# Patient Record
Sex: Male | Born: 1950 | Race: Black or African American | Hispanic: No | Smoking: Never smoker
Health system: Southern US, Community
[De-identification: ages and names within clinical notes are randomized; demographics above are authoritative.]

## PROBLEM LIST (undated history)

## (undated) DIAGNOSIS — R51 Headache: Secondary | ICD-10-CM

## (undated) DIAGNOSIS — R972 Elevated prostate specific antigen [PSA]: Secondary | ICD-10-CM

## (undated) DIAGNOSIS — R519 Headache, unspecified: Secondary | ICD-10-CM

## (undated) DIAGNOSIS — D3500 Benign neoplasm of unspecified adrenal gland: Secondary | ICD-10-CM

## (undated) DIAGNOSIS — M1990 Unspecified osteoarthritis, unspecified site: Secondary | ICD-10-CM

## (undated) DIAGNOSIS — N400 Enlarged prostate without lower urinary tract symptoms: Secondary | ICD-10-CM

## (undated) DIAGNOSIS — R319 Hematuria, unspecified: Secondary | ICD-10-CM

## (undated) DIAGNOSIS — R361 Hematospermia: Secondary | ICD-10-CM

## (undated) DIAGNOSIS — H269 Unspecified cataract: Secondary | ICD-10-CM

## (undated) HISTORY — DX: Benign neoplasm of unspecified adrenal gland: D35.00

## (undated) HISTORY — DX: Headache, unspecified: R51.9

## (undated) HISTORY — DX: Elevated prostate specific antigen (PSA): R97.20

## (undated) HISTORY — DX: Headache: R51

## (undated) HISTORY — DX: Unspecified osteoarthritis, unspecified site: M19.90

## (undated) HISTORY — DX: Hematuria, unspecified: R31.9

## (undated) HISTORY — DX: Hematospermia: R36.1

## (undated) HISTORY — DX: Unspecified cataract: H26.9

## (undated) HISTORY — DX: Benign prostatic hyperplasia without lower urinary tract symptoms: N40.0

## (undated) HISTORY — PX: PROSTATE BIOPSY: SHX241

---

## 1998-11-21 ENCOUNTER — Ambulatory Visit (HOSPITAL_COMMUNITY): Admission: RE | Admit: 1998-11-21 | Discharge: 1998-11-21 | Payer: Self-pay | Admitting: Orthopedic Surgery

## 1998-11-21 ENCOUNTER — Encounter: Payer: Self-pay | Admitting: Orthopedic Surgery

## 1999-01-12 ENCOUNTER — Ambulatory Visit (HOSPITAL_COMMUNITY): Admission: RE | Admit: 1999-01-12 | Discharge: 1999-01-12 | Payer: Self-pay | Admitting: Neurosurgery

## 1999-01-13 ENCOUNTER — Encounter: Payer: Self-pay | Admitting: Neurosurgery

## 1999-01-21 ENCOUNTER — Encounter: Payer: Self-pay | Admitting: Neurosurgery

## 1999-01-21 ENCOUNTER — Ambulatory Visit (HOSPITAL_COMMUNITY): Admission: RE | Admit: 1999-01-21 | Discharge: 1999-01-21 | Payer: Self-pay | Admitting: Neurosurgery

## 1999-02-12 ENCOUNTER — Emergency Department (HOSPITAL_COMMUNITY): Admission: EM | Admit: 1999-02-12 | Discharge: 1999-02-12 | Payer: Self-pay | Admitting: Emergency Medicine

## 1999-06-06 ENCOUNTER — Emergency Department (HOSPITAL_COMMUNITY): Admission: EM | Admit: 1999-06-06 | Discharge: 1999-06-06 | Payer: Self-pay | Admitting: Emergency Medicine

## 1999-08-05 ENCOUNTER — Ambulatory Visit (HOSPITAL_COMMUNITY): Admission: RE | Admit: 1999-08-05 | Discharge: 1999-08-05 | Payer: Self-pay | Admitting: Neurosurgery

## 1999-08-05 ENCOUNTER — Encounter: Payer: Self-pay | Admitting: Neurosurgery

## 2000-04-11 ENCOUNTER — Encounter: Admission: RE | Admit: 2000-04-11 | Discharge: 2000-07-10 | Payer: Self-pay | Admitting: Anesthesiology

## 2000-05-04 ENCOUNTER — Encounter: Payer: Self-pay | Admitting: Neurosurgery

## 2000-05-04 ENCOUNTER — Ambulatory Visit (HOSPITAL_COMMUNITY): Admission: RE | Admit: 2000-05-04 | Discharge: 2000-05-04 | Payer: Self-pay | Admitting: Neurosurgery

## 2000-07-16 ENCOUNTER — Encounter: Admission: RE | Admit: 2000-07-16 | Discharge: 2000-10-14 | Payer: Self-pay | Admitting: Anesthesiology

## 2000-08-09 ENCOUNTER — Encounter: Payer: Self-pay | Admitting: Family Medicine

## 2000-08-09 ENCOUNTER — Encounter: Admission: RE | Admit: 2000-08-09 | Discharge: 2000-08-09 | Payer: Self-pay | Admitting: Family Medicine

## 2001-08-22 ENCOUNTER — Encounter (INDEPENDENT_AMBULATORY_CARE_PROVIDER_SITE_OTHER): Payer: Self-pay | Admitting: *Deleted

## 2001-08-22 ENCOUNTER — Ambulatory Visit (HOSPITAL_COMMUNITY): Admission: RE | Admit: 2001-08-22 | Discharge: 2001-08-22 | Payer: Self-pay | Admitting: Gastroenterology

## 2004-11-27 HISTORY — PX: SHOULDER SURGERY: SHX246

## 2004-11-27 HISTORY — PX: ROTATOR CUFF REPAIR: SHX139

## 2004-11-27 HISTORY — PX: CATARACT EXTRACTION: SUR2

## 2005-07-09 ENCOUNTER — Encounter: Admission: RE | Admit: 2005-07-09 | Discharge: 2005-07-09 | Payer: Self-pay | Admitting: Family Medicine

## 2005-08-09 ENCOUNTER — Encounter: Admission: RE | Admit: 2005-08-09 | Discharge: 2005-08-09 | Payer: Self-pay | Admitting: Family Medicine

## 2005-09-01 ENCOUNTER — Encounter: Admission: RE | Admit: 2005-09-01 | Discharge: 2005-09-01 | Payer: Self-pay | Admitting: Family Medicine

## 2005-09-17 ENCOUNTER — Encounter: Admission: RE | Admit: 2005-09-17 | Discharge: 2005-09-17 | Payer: Self-pay | Admitting: Family Medicine

## 2005-09-28 ENCOUNTER — Encounter: Admission: RE | Admit: 2005-09-28 | Discharge: 2005-09-28 | Payer: Self-pay | Admitting: Family Medicine

## 2005-10-29 ENCOUNTER — Encounter: Admission: RE | Admit: 2005-10-29 | Discharge: 2005-10-29 | Payer: Self-pay | Admitting: Family Medicine

## 2005-11-10 ENCOUNTER — Ambulatory Visit (HOSPITAL_BASED_OUTPATIENT_CLINIC_OR_DEPARTMENT_OTHER): Admission: RE | Admit: 2005-11-10 | Discharge: 2005-11-10 | Payer: Self-pay | Admitting: Orthopaedic Surgery

## 2005-11-10 ENCOUNTER — Ambulatory Visit (HOSPITAL_COMMUNITY): Admission: RE | Admit: 2005-11-10 | Discharge: 2005-11-10 | Payer: Self-pay | Admitting: Orthopaedic Surgery

## 2005-11-24 ENCOUNTER — Encounter: Admission: RE | Admit: 2005-11-24 | Discharge: 2005-11-24 | Payer: Self-pay | Admitting: Family Medicine

## 2005-11-28 ENCOUNTER — Encounter: Admission: RE | Admit: 2005-11-28 | Discharge: 2006-01-09 | Payer: Self-pay | Admitting: Orthopaedic Surgery

## 2006-01-22 ENCOUNTER — Ambulatory Visit: Payer: Self-pay | Admitting: Internal Medicine

## 2006-02-12 ENCOUNTER — Encounter: Admission: RE | Admit: 2006-02-12 | Discharge: 2006-05-13 | Payer: Self-pay | Admitting: Internal Medicine

## 2006-03-14 ENCOUNTER — Ambulatory Visit: Payer: Self-pay | Admitting: Internal Medicine

## 2006-04-15 ENCOUNTER — Encounter: Admission: RE | Admit: 2006-04-15 | Discharge: 2006-04-15 | Payer: Self-pay | Admitting: Family Medicine

## 2006-05-14 ENCOUNTER — Encounter: Admission: RE | Admit: 2006-05-14 | Discharge: 2006-08-12 | Payer: Self-pay | Admitting: Internal Medicine

## 2006-07-03 ENCOUNTER — Encounter: Admission: RE | Admit: 2006-07-03 | Discharge: 2006-07-03 | Payer: Self-pay | Admitting: Family Medicine

## 2006-07-10 ENCOUNTER — Encounter: Admission: RE | Admit: 2006-07-10 | Discharge: 2006-07-10 | Payer: Self-pay | Admitting: Specialist

## 2006-08-01 ENCOUNTER — Encounter: Admission: RE | Admit: 2006-08-01 | Discharge: 2006-08-01 | Payer: Self-pay | Admitting: Specialist

## 2006-08-13 ENCOUNTER — Encounter: Admission: RE | Admit: 2006-08-13 | Discharge: 2006-11-11 | Payer: Self-pay | Admitting: Internal Medicine

## 2006-09-19 ENCOUNTER — Encounter: Admission: RE | Admit: 2006-09-19 | Discharge: 2006-09-19 | Payer: Self-pay | Admitting: Specialist

## 2007-03-01 ENCOUNTER — Encounter: Admission: RE | Admit: 2007-03-01 | Discharge: 2007-03-01 | Payer: Self-pay | Admitting: Family Medicine

## 2007-04-20 ENCOUNTER — Emergency Department (HOSPITAL_COMMUNITY): Admission: EM | Admit: 2007-04-20 | Discharge: 2007-04-20 | Payer: Self-pay | Admitting: Emergency Medicine

## 2007-04-27 ENCOUNTER — Ambulatory Visit (HOSPITAL_COMMUNITY): Admission: RE | Admit: 2007-04-27 | Discharge: 2007-04-27 | Payer: Self-pay | Admitting: Family Medicine

## 2007-05-01 ENCOUNTER — Ambulatory Visit (HOSPITAL_COMMUNITY): Admission: RE | Admit: 2007-05-01 | Discharge: 2007-05-01 | Payer: Self-pay | Admitting: Orthopaedic Surgery

## 2007-05-27 ENCOUNTER — Encounter: Admission: RE | Admit: 2007-05-27 | Discharge: 2007-05-27 | Payer: Self-pay | Admitting: Orthopaedic Surgery

## 2007-08-01 ENCOUNTER — Encounter: Admission: RE | Admit: 2007-08-01 | Discharge: 2007-08-01 | Payer: Self-pay | Admitting: Family Medicine

## 2007-11-01 ENCOUNTER — Encounter: Admission: RE | Admit: 2007-11-01 | Discharge: 2007-11-01 | Payer: Self-pay | Admitting: Family Medicine

## 2007-11-12 ENCOUNTER — Encounter: Admission: RE | Admit: 2007-11-12 | Discharge: 2007-11-12 | Payer: Self-pay | Admitting: Family Medicine

## 2008-01-07 ENCOUNTER — Encounter: Admission: RE | Admit: 2008-01-07 | Discharge: 2008-01-07 | Payer: Self-pay | Admitting: Family Medicine

## 2008-01-22 ENCOUNTER — Ambulatory Visit (HOSPITAL_COMMUNITY): Admission: RE | Admit: 2008-01-22 | Discharge: 2008-01-22 | Payer: Self-pay | Admitting: Family Medicine

## 2010-12-17 ENCOUNTER — Encounter: Payer: Self-pay | Admitting: Family Medicine

## 2010-12-18 ENCOUNTER — Encounter: Payer: Self-pay | Admitting: Specialist

## 2010-12-18 ENCOUNTER — Encounter: Payer: Self-pay | Admitting: Family Medicine

## 2011-04-14 NOTE — Procedures (Signed)
[place]  Patient:    Ronald Davenport, Ronald Davenport Visit Number: [id number] MRN: [id number]          Service Type: END Location: ENDO Attending Physician:  Charna Elizabeth Dictated by:   Anselmo Rod, M.D. Proc. Date: 08/22/01 Admit Date:  08/22/2001   CC:         Gabriel Earing, M.D.   Procedure Report  DATE OF BIRTH:  [birth year].  REFERRING PHYSICIAN:  Gabriel Earing, M.D.  PROCEDURE PERFORMED:  Colonoscopy with biopsies.  ENDOSCOPIST:  Anselmo Rod, M.D.  INSTRUMENT USED:  Olympus video colonoscope.  INDICATIONS FOR PROCEDURE:  The patient is a 60 year old African-American male with a history of blood in stool and family history of colon cancer (a brother diagnosed in early 18s), Rule out colonic polyps, masses, hemorrhoids, etc.  PREPROCEDURE PREPARATION:  Informed consent was procured from the patient. The patient was fasted for eight hours prior to the procedure and prepped with a bottle of magnesium citrate and a gallon of NuLytely the night prior to the procedure.  PREPROCEDURE PHYSICAL:  The patient had stable vital signs.  Neck supple. Chest clear to auscultation.  S1, S2 regular.  Abdomen soft with normal abdominal bowel sounds.  DESCRIPTION OF PROCEDURE:  The patient was placed in the left lateral decubitus position and sedated with 40 mg of Demerol and 4 mg of Versed intravenously.  Once the patient was adequately sedated and maintained on low-flow oxygen and continuous cardiac monitoring, the Olympus video colonoscope was advanced from the rectum to the cecum without difficulty. The patient had a fairly good prep.  There was a small edematous fold seen at 60 cm with some fresh bleeding around it.  Reason for this was unclear.  I doubt if this is scope trauma, multiple biopsies were done to rule out malignancy.  There were a few early left-sided diverticula seen in the left colon.  No other abnormalities were noted up to the  cecum.  IMPRESSION: 1. Small edematous fold biopsied at 60 cm. 2. Early left-sided diverticula. 3. No other masses or polyps seen.  RECOMMENDATIONS: 1. Await pathology results.  Avoid all nonsteroidals. 2. Outpatient follow-up in the office in the next two weeks.Dictated by: Anselmo Rod, M.D. Attending Physician:  Charna Elizabeth DD:  08/22/01 TD:  08/22/01 Job: [id number]

## 2011-04-14 NOTE — Op Note (Signed)
NAME:  Ronald Davenport, Ronald Davenport                 ACCOUNT NO.:  [id number]   MEDICAL RECORD NO.:  [id number]          PATIENT TYPE:  AMB   LOCATION:  DSC                          FACILITY:  [facility]   PHYSICIAN:  Vanita Panda. Magnus Ivan, M.D.DATE OF BIRTH:  12/06/1950   DATE OF PROCEDURE:  11/10/2005  DATE OF DISCHARGE:                                 OPERATIVE REPORT   PREOPERATIVE DIAGNOSIS:  Right shoulder impingement with partial-thickness  rotator cuff tear and significant acromioclavicular joint arthritis.   POSTOPERATIVE DIAGNOSIS:  Right shoulder impingement with partial-thickness  rotator cuff tear and significant acromioclavicular joint arthritis.   PROCEDURE:  1.  Right shoulder arthroscopy with debridement.  2.  Subacromial decompression.  3.  Open distal clavicle resection.   SURGEON:  Vanita Panda. Magnus Ivan, MD   ANESTHESIA:  1.  Interscalene right shoulder block.  2.  General anesthesia.   BLOOD LOSS:  Minimal.   ANTIBIOTICS:  1 gram IV Ancef.   COMPLICATIONS:  None.   INDICATIONS:  Briefly Mr. Minahan is a pleasant 60 year old gentleman from  [place] who has lived in the [place] for some time. He had been  experiencing right shoulder pain for several years. He eventually did seek  medical care for this.  He was followed by a primary care physician for a  year and had injections in his [facility] joint on the right shoulder as well as the  subacromial space. He continued to have shoulder pain in spite of physical  therapy and nonsteroidal anti-inflammatory medications. An MRI was obtained  that showed bursitis in the subacromial space and significant AC joint  arthritis and also was significant for a partial thickness supraspinatus  tendon tear. Due to continued shoulder pain in his dominant shoulder, it is  recommended that he undergo arthroscopy.  The risks and benefits of this  were explained to him. I also talked to him about open distal clavicle  resection given the amount  of his arthritis; and he understood the  possibility of this; and also explained to him the need for a rotator cuff  repair if there was a full-thickness tear; and he agreed with this as well.   PROCEDURE DESCRIPTION:  After informed consent was obtained and interscalene  block was obtained on the right shoulder; he was brought to the operating  room, placed supine on the operating table, and general anesthesia was then  obtained.  Mr. Messmer was then positioned into a beach chair position and a  fishing pole was used as in-line traction with the humerus with about 45-  degrees of forward flexion and neutral rotation. The arm was prepped and  draped with DuraPrep and sterile drapes.  Incisions were then made  posteriorly off of the posterolateral edge of the acromion one fingerbreadth  distal and two fingerbreadths medial. Arthroscopic cannula was inserted into  the glenohumeral joint; and the camera was inserted and a diagnostic tour  was undertaken of the glenohumeral joint. There was some undersurface  tearing of the rotator cuff. The biceps tendon was intact in its entirety.  There was some fraying of  the subscapularis tendon as well. The labrum  appeared to be intact.   An anterior portal was made just lateral to the coracoid process at the  rotator interval, and a Arthrex one was inserted. There was a minimal  debridement carried out on the undersurface of the rotator cuff. Again a  probe was inserted, and the biceps tendon was probed. There was minimal  debridement of the subscapularis tendon was performed as well.   Next, the subacromial space was entered through the posterior portal. A  lateral portal was then made as well over the lateral edge of the acromion;  and there was significant bursitis noted in the subacromial space.  An  Arthrex-1 was inserted in this area and ablation was carried out; followed  by a shaver which was used to further debride the bursa. I put this  shoulder  through a range of motion; and a there was no further tearing of the rotator  cuff noted and the cuff did move in its entirety. There was no redundancy of  the cuff noted as well.  A high-speed bur was inserted and a bone spur was  debrided under the undersurface of the acromion.   Next, the arthroscopic equipment was removed, and I palpated the prominence  of the clavicle.  Using a spinal needle I was able to locate the [facility] joint  and a small incision was made, longitudinally, over the [facility] joint. The soft  tissue was divided sharply with a knife. Hemostasis was obtained easily. The  joint AC joint was exposed; and then using a small MicroChoice saw with a  small blade as an oscillating saw, the distal clavicle was resected for a  small wafer of this to expose the [facility] joint and the arthritis, in this area  was removed. The clavicle remained stable as well as the acromion. This  wound was then copiously irrigated with normal saline solution. The tissue  was closed back over the acromion with interrupted #0 Vicryl suture,  followed by 2-0 Vicryl in the subcutaneous tissue, and 3-0 nylon on the  skin. The portal sites were also closed as well. Well-padded sterile  dressing was placed around this. The patient's arm was then placed in a  sling. He was awakened, extubated, and taken to the recovery room in stable  condition. There no complications.           ______________________________  Vanita Panda. Magnus Ivan, M.D.     CYB/MEDQ  D:  11/10/2005  T:  11/13/2005  Job:  [id number]

## 2011-04-14 NOTE — Procedures (Signed)
[facility]  Patient:    Ronald Davenport, Ronald Davenport                       MRN: [id number] Proc. Date: 04/18/00 Adm. Date:  [id number] Attending:  Thyra Breed CC:         Reinaldo Meeker, M.D.                           Procedure Report  PROCEDURE:  Lumbar epidural steroid injection.  DIAGNOSES:  Lumbar spinal stenosis and lumbar radiculopathy.  ANESTHESIOLOGIST:  Thyra Breed, M.D.  INDICATIONS:  The patient has noted some modest improvement in his back discomfort. He continues to have a radicular component.  Nevertheless, he wishes to proceed  with his injection today.  PHYSICAL EXAMINATION:  VITAL SIGNS:  Blood pressure 119/60, heart rate 74, respirations 12, O2 saturation 98%.  Pain level is 6/10.  Temperature 97.4 degrees.  BACK:  Shows good healing from his previous injection site.  NEUROLOGIC:  Examination is grossly unchanged.  DESCRIPTION OF PROCEDURE:  After the informed consent was obtained, the patient was placed in the sitting position and monitored.  His back was prepped with Betadine x 3.  A skin wheel was raised at the L4-5 interspace with 1% lidocaine.  A #20 gauge Tuohy needle was introduced in the lumbar epidural space, and a loss of resistance to preservative-free normal saline.  There is no CSF or blood.  Then 120 mg of Medrol and 10 ml of preservative-free normal saline were very gently injected. The needle was flushed with preservative-free normal saline and removed intact.  POST-PROCEDURE CONDITION:  Stable.  DISCHARGE INSTRUCTIONS: 1. Resume previous diet. 2. Limitation of activities per the instruction sheet. 3. Continue on the current medications. 4. The patient plans to follow up with Dr. Reinaldo Meeker. DD:  04/18/00 TD:  04/22/00 Job: [id number]

## 2011-04-14 NOTE — Procedures (Signed)
[facility]  Patient:    Ronald Davenport, Ronald Davenport                       MRN: [id number] Proc. Date: 04/11/00 Adm. Date:  [id number] Attending:  Thyra Breed CC:         Reinaldo Meeker, M.D.                           Procedure Report  PROCEDURE PERFORMED:  Lumbar epidural steroid injection.  ANESTHESIOLOGIST:  Thyra Breed, M.D.  DIAGNOSIS:  Lumbar spinal stenosis and lumbar radiculopathy.  INTERVAL HISTORY:  Ronald Davenport is a very pleasant 16, who is sent to [place] by Dr. Aliene Beams for a series of lumbar epidural steroid injections.  The patient states that that he has a long history of back problems dating back to November of 1996.  At that time he developed mild discomfort which would improve and became quite severe in 1997.  Through 1998 he did fine and then in December of 1999, his pain increased significantly.  Through the course of the exacerbations, he was seen by Dr. Franky Macho and sent to [facility] for two epidurals and to the radiologist for two epidurals and would get somne respite from his pain.  Beginning in August of 2000, he began to see Dr. Gerlene Fee.  He was treated conservatively for a disk abnormality which was noted on previous MRIs with nonsteroidals and steroids.  He did not improve and underwent a repeat MRI which demonstrated spinal stenosis.  A myelogram was performed on September 8 which showed fairly severe stenosis at 4-5 and 5-S1. His father died and he had to return to [place] in October of 2000.  While there, he developed increasing radicular problems all the way to his right foot with associated numbness and tingling.  He apparently underwent a repeat myelogram which demonstrated a herniated disk and got an epidural steroid injection performed there so that he could return home.  [place], he had been seeing Dr. Gerlene Fee and was sent for a series of lumbar epidural steroid injections today.  He states  that the pain and soreness in his back which radiates out to his right lower extremity predominantly with associated numbness and tingling and occasional weakness in the right lower extremity.  His pain is made worse by standing too long, sitting too long, lifting or bending.  It is improved by lying down.  He has been using Advil to try and control his discomfort.  CURRENT MEDICATIONS:  Advil.  ALLERGIES:  No known drug allergies.  FAMILY HISTORY:  The patient stated this is noncontributory.  PAST SURGICAL HISTORY:  He has had a cyst from his right groin region.  SOCIAL HISTORY: The patient works in [employer].  He does not smoke or drink alcohol.  He denied any active medical problems.  REVIEW OF SYSTEMS:  See HPI for pertinent for positives.  PHYSICAL EXAMINATION:  Blood pressure 123/69, heart rate 67, respiratory rate 16, oxygen saturation 94%, pain level is 8/10.  Temperature is 97.7.  GENERAL:  The patient is a very pleasant male in no acute distress.  HEENT:  Head was normocephalic and atraumatic.  Extraocular movements were intact.  Conjunctivae and sclerae were clear.  Nose had patent nares. Oropharynx free of lesions.  Neck was supple without lymphadenopathy. Carotids 2+ and symmetrical without bruits.  LUNGS:  Clear.  HEART:  Regular  rate and rhythm.  ABDOMEN:  Bowel sounds present.  GENITALIA AND RECTAL:  Not performed.  BACK:  No tenderness to percussion over the vertebrae with negative straight leg raising signs.  EXTREMITIES:  No clubbing, cyanosis or edema with radial pulses and dorsalis pedis pulses 1 to 2+ and symmetric.  NEUROLOGIC:  Patient was oriented x 4.  Cranial nerves 2-12 were grossly intact.  Deep tendon reflexes were symmetric in the upper and lower extremities with downgoing toes.  Motor was 5/5 with symmetric bulk and tone and sensation showed attentuated pinprick over the lateral aspect of the right calf suggestive of an L5  radiculopathy.  IMPRESSION: 1. Lumbar spinal stenosis with L5 radiculopathy to the right lower extremity.  DISPOSITION:  I discussed the potential risk, benefits and limitations of a lumbar epidural steroid injection in detail with the patient as well as with his friend.  He is interested in proceeding with this.  DESCRIPTION OF PROCEDURE:  After informed consent was obtained, the patient was placed in sitting position and monitored.  His back was prepped with Betadine x 3.  A skin wheal was raised at the L4-5 interspace with 1% lidocaine.  A 20 gauge Tuohy needle was introduced into the lumbar epidural space to a loss of resistance to preservative free normal saline.  There was no CSF or blood.  80 mg of Medrol in 10 ml of preservative free normal saline were gently injected.  The needle was flushed with preservative free normal saline and removed intact.  Postprocedure condition was stable.  DISCHARGE INSTRUCTIONS: 1. Resume previous diet. 2. Limitations in activities per instruction sheet. 3. Continue on current medications. 4. Follow up with me in one week for a second injection. DD:  04/11/00 TD:  04/16/00 Job: [id number]

## 2011-04-14 NOTE — Procedures (Signed)
[facility]  Patient:    Ronald Davenport, Ronald Davenport                       MRN: [id number] Proc. Date: 07/16/00 Adm. Date:  [id number] Attending:  Thyra Breed CC:         Reinaldo Meeker, M.D.   Procedure Report  PROCEDURE:  Lumbar epidural steroid injection.  DIAGNOSIS:  Lumbar spinal stenosis.  INTERVAL HISTORY:  The patient did well after his last set of injections back in May where his pain was predominantly out into the right lower extremity, now he has got more left sided discomfort and Dr. Gerlene Fee has arranged for him to come back for another epidural today. He is very concerned and rates his pain at 8/10 out into the left lower extremity.  PHYSICAL EXAMINATION:  VITAL SIGNS:  Blood pressure is 124/74, heart rate 59, respiratory rate 16, O2 saturations 96%, temperature is 96.8 and pain level is 8/10.  EXTREMITIES:  He exhibits symmetric deep tendon reflexes at the knees and ankles with negative straight leg raise signs today.  DESCRIPTION OF PROCEDURE:  After informed consent was obtained, the patient was placed in the sitting position and monitored. The patients back was prepped with Betadine x 3. I anesthetized the L4-5 interspace with 1% lidocaine using a 25 gauge needle. A 20 gauge Tuohy needle was introduced to the lumbar epidural space to loss of resistance to preservative free normal saline. There was no cerebrospinal fluid nor blood. 120 mg of Medrol and 8 ml of preservative free normal saline was gently injected. The needle was flushed with preservative free normal saline and removed intact.  CONDITION POST PROCEDURE:  Stable.  DISCHARGE INSTRUCTIONS:  Resume previous diet. Limitations in activities per instruction sheet. Continue on current medications. The patient plans to follow-up with Dr. Gerlene Fee at which time a decision with regard to a repeat injection will be made. DD:  07/16/00 TD:  07/16/00 Job: [id number]

## 2013-02-12 ENCOUNTER — Encounter: Payer: Self-pay | Admitting: *Deleted

## 2013-02-12 DIAGNOSIS — R972 Elevated prostate specific antigen [PSA]: Secondary | ICD-10-CM | POA: Insufficient documentation

## 2013-08-16 ENCOUNTER — Ambulatory Visit (INDEPENDENT_AMBULATORY_CARE_PROVIDER_SITE_OTHER): Payer: Medicare Other | Admitting: Family Medicine

## 2013-08-16 VITALS — BP 158/85 | HR 69 | Temp 97.7°F | Resp 16 | Ht 70.0 in | Wt 152.0 lb

## 2013-08-16 DIAGNOSIS — K644 Residual hemorrhoidal skin tags: Secondary | ICD-10-CM

## 2013-08-16 DIAGNOSIS — R972 Elevated prostate specific antigen [PSA]: Secondary | ICD-10-CM

## 2013-08-16 DIAGNOSIS — Z1211 Encounter for screening for malignant neoplasm of colon: Secondary | ICD-10-CM

## 2013-08-16 DIAGNOSIS — K625 Hemorrhage of anus and rectum: Secondary | ICD-10-CM

## 2013-08-16 DIAGNOSIS — Z125 Encounter for screening for malignant neoplasm of prostate: Secondary | ICD-10-CM

## 2013-08-16 LAB — POCT CBC
Granulocyte percent: 51 %G (ref 37–80)
HCT, POC: 48.3 % (ref 43.5–53.7)
Hemoglobin: 15.5 g/dL (ref 14.1–18.1)
Lymph, poc: 1.5 (ref 0.6–3.4)
MCH, POC: 31.8 pg — AB (ref 27–31.2)
MCV: 99 fL — AB (ref 80–97)
POC Granulocyte: 1.9 — AB (ref 2–6.9)
POC LYMPH PERCENT: 41.8 %L (ref 10–50)
RDW, POC: 13.8 %

## 2013-08-16 MED ORDER — HYDROCORTISONE ACETATE 25 MG RE SUPP: 25.0000 mg | Freq: Two times a day (BID) | RECTAL | Status: DC

## 2013-08-16 NOTE — Progress Notes (Signed)
[address]   [place], [place]  [address]   [phone]  Subjective:    Patient ID: Ronald Davenport, male    DOB: 23-Jun-1951, 62 y.o.   MRN: [id number]  HPI This 62 y.o. male presents for evaluation of hemorrhoidal pain.  Onset of abdominal pain two weeks ago; pain with palpation of abdomen; +diarrhea with abdominal pain.  At same time, suffering with rectal itching and pain.  For the past three days, blood on toilet paper.  No blood in stool.  Has only had one stool in past three days.  If wipes anus without stool, there is presence of blood. Previously, swelling around anal region.  +itching; pain is milder now.  +HA four days ago; took medication with improvement. +feverish two weeks ago.  Pain with bowel movement.  Colonoscopy 11 years ago at [facility]; knows that due for repeat.  2. Elevated PSA: followed by urology; requesting repeat PSA today.  Review of Systems  Constitutional: Negative for chills, diaphoresis, activity change and fatigue.  Gastrointestinal: Positive for diarrhea, constipation, blood in stool and anal bleeding. Negative for nausea, vomiting, abdominal pain, abdominal distention and rectal pain.  Genitourinary: Negative for hematuria, flank pain and decreased urine volume.   Past Medical History  Diagnosis Date  . Arthritis    Past Surgical History  Procedure Laterality Date  . Cataract extraction Left 2006   No Known Allergies No current outpatient prescriptions on file prior to visit.   No current facility-administered medications on file prior to visit.   History   Social History  . Marital Status: Married    Spouse Name: N/A    Number of Children: N/A  . Years of Education: N/A   Occupational History  . disability     DDD lumbar/chronic back pain   Social History Main Topics  . Smoking status: Never Smoker   . Smokeless tobacco: Never Used  . Alcohol Use: Yes     Comment: occasionally   . Drug Use: No  . Sexual Activity: Yes   Other  Topics Concern  . Not on file   Social History Narrative   From [place]; [place] since 1980s.   Family History  Problem Relation Age of Onset  . Colon cancer Brother        Objective:   Physical Exam  Nursing note and vitals reviewed. Constitutional: He appears well-developed and well-nourished. No distress.  HENT:  Head: Normocephalic and atraumatic.  Mouth/Throat: Oropharynx is clear and moist.  Eyes: Conjunctivae and EOM are normal. Pupils are equal, round, and reactive to light.  Neck: Neck supple. No thyromegaly present.  Cardiovascular: Normal rate, regular rhythm and normal heart sounds.   No murmur heard. Pulmonary/Chest: Effort normal and breath sounds normal. He has no wheezes. He has no rales.  Abdominal: Soft. Bowel sounds are normal. He exhibits no distension and no mass. There is no tenderness. There is no rebound and no guarding.  Genitourinary: Rectal exam shows external hemorrhoid. Rectal exam shows no mass, no tenderness and anal tone normal.  Lymphadenopathy:    He has no cervical adenopathy.  Skin: He is not diaphoretic.   Results for orders placed in visit on 08/16/13  PSA, MEDICARE      Result Value Range   PSA 5.27 (*) <=4.00 ng/mL  POCT CBC      Result Value Range   WBC 3.7 (*) 4.6 - 10.2 K/uL   Lymph, poc 1.5  0.6 - 3.4   POC  LYMPH PERCENT 41.8  10 - 50 %L   MID (cbc) 0.3  0 - 0.9   POC MID % 7.2  0 - 12 %M   POC Granulocyte 1.9 (*) 2 - 6.9   Granulocyte percent 51.0  37 - 80 %G   RBC 4.88  4.69 - 6.13 M/uL   Hemoglobin 15.5  14.1 - 18.1 g/dL   HCT, POC 16.1  09.6 - 53.7 %   MCV 99.0 (*) 80 - 97 fL   MCH, POC 31.8 (*) 27 - 31.2 pg   MCHC 32.1  31.8 - 35.4 g/dL   RDW, POC 04.5     Platelet Count, POC 273  142 - 424 K/uL   MPV 8.5  0 - 99.8 fL       Assessment & Plan:  Anal bleeding - Plan: POCT CBC, Ambulatory referral to Gastroenterology  External hemorrhoids - Plan: POCT CBC, DISCONTINUED: hydrocortisone (ANUSOL-HC) 25 MG  suppository  Colon cancer screening  Prostate cancer screening - Plan: PSA, Medicare  Elevated PSA  1. Anal bleeding:  New.  Consistent with hemorrhoidal bleeding but also due for repeat colonoscopy; refer to GI.  No evidence of anemia.  RTC immediately for large amounts of anal bleeding. 2.  External hemorrhoids:  Chronic; with recent anal bleeding; treat with Anusol HC suppositories; recommend stool softener daily to prevent constipation. 3.  Colon cancer screening: refer to GI for colonoscopy. 4.  Elevated PSA:  Chronic; obtain PSA today; followed by urology; will fax results of PSA to urology for comparison.  Meds ordered this encounter  Medications  . DISCONTD: hydrocortisone (ANUSOL-HC) 25 MG suppository    Sig: Place 1 suppository (25 mg total) rectally 2 (two) times daily.    Dispense:  24 suppository    Refill:  3

## 2013-08-16 NOTE — Patient Instructions (Signed)
Hemorrhoids Hemorrhoids are swollen veins around the rectum or anus. There are two types of hemorrhoids:   Internal hemorrhoids. These occur in the veins just inside the rectum. They may poke through to the outside and become irritated and painful.  External hemorrhoids. These occur in the veins outside the anus and can be felt as a painful swelling or hard lump near the anus. CAUSES  Pregnancy.   Obesity.   Constipation or diarrhea.   Straining to have a bowel movement.   Sitting for long periods on the toilet.  Heavy lifting or other activity that caused you to strain.  Anal intercourse. SYMPTOMS   Pain.   Anal itching or irritation.   Rectal bleeding.   Fecal leakage.   Anal swelling.   One or more lumps around the anus.  DIAGNOSIS  Your caregiver may be able to diagnose hemorrhoids by visual examination. Other examinations or tests that may be performed include:   Examination of the rectal area with a gloved hand (digital rectal exam).   Examination of anal canal using a small tube (scope).   A blood test if you have lost a significant amount of blood.  A test to look inside the colon (sigmoidoscopy or colonoscopy). TREATMENT Most hemorrhoids can be treated at home. However, if symptoms do not seem to be getting better or if you have a lot of rectal bleeding, your caregiver may perform a procedure to help make the hemorrhoids get smaller or remove them completely. Possible treatments include:   Placing a rubber band at the base of the hemorrhoid to cut off the circulation (rubber band ligation).   Injecting a chemical to shrink the hemorrhoid (sclerotherapy).   Using a tool to burn the hemorrhoid (infrared light therapy).   Surgically removing the hemorrhoid (hemorrhoidectomy).   Stapling the hemorrhoid to block blood flow to the tissue (hemorrhoid stapling).  HOME CARE INSTRUCTIONS   Eat foods with fiber, such as whole grains, beans,  nuts, fruits, and vegetables. Ask your doctor about taking products with added fiber in them (fibersupplements).  Increase fluid intake. Drink enough water and fluids to keep your urine clear or pale yellow.   Exercise regularly.   Go to the bathroom when you have the urge to have a bowel movement. Do not wait.   Avoid straining to have bowel movements.   Keep the anal area dry and clean. Use wet toilet paper or moist towelettes after a bowel movement.   Medicated creams and suppositories may be used or applied as directed.   Only take over-the-counter or prescription medicines as directed by your caregiver.   Take warm sitz baths for 15 20 minutes, 3 4 times a day to ease pain and discomfort.   Place ice packs on the hemorrhoids if they are tender and swollen. Using ice packs between sitz baths may be helpful.   Put ice in a plastic bag.   Place a towel between your skin and the bag.   Leave the ice on for 15 20 minutes, 3 4 times a day.   Do not use a donut-shaped pillow or sit on the toilet for long periods. This increases blood pooling and pain.  SEEK MEDICAL CARE IF:  You have increasing pain and swelling that is not controlled by treatment or medicine.  You have uncontrolled bleeding.  You have difficulty or you are unable to have a bowel movement.  You have pain or inflammation outside the area of the hemorrhoids. MAKE SURE YOU:    Understand these instructions.  Will watch your condition.  Will get help right away if you are not doing well or get worse. Document Released: 11/10/2000 Document Revised: 10/30/2012 Document Reviewed: 09/17/2012 [employer] Patient Information 2014 [employer].  

## 2013-08-17 LAB — PSA, MEDICARE: PSA: 5.27 ng/mL — ABNORMAL HIGH (ref <=4.00)

## 2013-08-18 ENCOUNTER — Telehealth: Payer: Self-pay | Admitting: Radiology

## 2013-08-18 MED ORDER — HYDROCORTISONE ACETATE 30 MG RE SUPP: 1.0000 | Freq: Two times a day (BID) | RECTAL | Status: DC | PRN

## 2013-08-18 NOTE — Telephone Encounter (Signed)
Patient indicates the suppositories are expensive, wants something cheaper.

## 2013-08-18 NOTE — Telephone Encounter (Signed)
Please give pt a call at [phone] when rx is ready. He states that he really needs the rx today.

## 2013-08-18 NOTE — Telephone Encounter (Signed)
Rx changed to Proctocort suppositories.

## 2013-08-19 NOTE — Telephone Encounter (Signed)
States new rx $67 old one was $24. Patient will call his insurance to see what is the preferred medication.

## 2013-08-19 NOTE — Telephone Encounter (Signed)
Insurance company indicates suppository for hemorrhoids are not covered at all, he is to use a gel, cream , or ointment. Please advise.

## 2013-08-19 NOTE — Telephone Encounter (Signed)
Called patient to advise  °

## 2013-08-19 NOTE — Telephone Encounter (Signed)
He wants to be called when something is sent in.

## 2013-08-19 NOTE — Telephone Encounter (Signed)
Spoke to patient, he did call the insurance company

## 2013-08-19 NOTE — Telephone Encounter (Signed)
Patient called to check status on previous message. Spoke with Eula Listen PA-C and change to Lidocaine Cream 5% apply tid #30 gram 0 refills. Called patient and voiced understanding.  Called rx to [address]

## 2013-08-19 NOTE — Telephone Encounter (Signed)
He states the Rx is $397

## 2013-09-17 ENCOUNTER — Encounter: Payer: Self-pay | Admitting: Internal Medicine

## 2013-09-22 ENCOUNTER — Ambulatory Visit: Payer: Medicare Other

## 2013-09-22 ENCOUNTER — Ambulatory Visit (INDEPENDENT_AMBULATORY_CARE_PROVIDER_SITE_OTHER): Payer: Medicare Other | Admitting: Family Medicine

## 2013-09-22 VITALS — BP 120/70 | HR 60 | Temp 98.0°F | Resp 16 | Ht 67.0 in | Wt 152.0 lb

## 2013-09-22 DIAGNOSIS — M25562 Pain in left knee: Secondary | ICD-10-CM

## 2013-09-22 DIAGNOSIS — M542 Cervicalgia: Secondary | ICD-10-CM

## 2013-09-22 DIAGNOSIS — M545 Low back pain, unspecified: Secondary | ICD-10-CM

## 2013-09-22 DIAGNOSIS — M25569 Pain in unspecified knee: Secondary | ICD-10-CM

## 2013-09-22 DIAGNOSIS — M62838 Other muscle spasm: Secondary | ICD-10-CM

## 2013-09-22 DIAGNOSIS — M25519 Pain in unspecified shoulder: Secondary | ICD-10-CM

## 2013-09-22 MED ORDER — METHOCARBAMOL 500 MG PO TABS: 500.0000 mg | ORAL_TABLET | Freq: Four times a day (QID) | ORAL | Status: DC

## 2013-09-22 MED ORDER — HYDROCODONE-ACETAMINOPHEN 5-325 MG PO TABS: 1.0000 | ORAL_TABLET | Freq: Four times a day (QID) | ORAL | Status: DC | PRN

## 2013-09-22 MED ORDER — NAPROXEN 500 MG PO TABS: 500.0000 mg | ORAL_TABLET | Freq: Two times a day (BID) | ORAL | Status: DC

## 2013-09-22 NOTE — Patient Instructions (Signed)
Take the naproxyn with breakfast and supper.  Take the methocarbamol for muscle relaxant whenever needed - especially take before bed.  Take lots of hot baths and showers. Use a heating pad as frequently as possible - make one yourself by putting dry, uncooked rice and/or beans into a pillowcase and heat it in the microwave.  If you have additional pian, take 1/2 or 1 tab of the hydrocodone. I will recheck you here on Thursday before 2.
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
## 2013-09-24 NOTE — Progress Notes (Signed)
PA approved for methocarbamol through 11/26/14. Notified pharmacy.

## 2013-09-25 ENCOUNTER — Ambulatory Visit (INDEPENDENT_AMBULATORY_CARE_PROVIDER_SITE_OTHER): Payer: Medicare Other | Admitting: Family Medicine

## 2013-09-25 VITALS — BP 138/70 | HR 60 | Temp 97.9°F | Resp 14 | Ht 66.0 in | Wt 152.2 lb

## 2013-09-25 DIAGNOSIS — M542 Cervicalgia: Secondary | ICD-10-CM

## 2013-09-25 DIAGNOSIS — M549 Dorsalgia, unspecified: Secondary | ICD-10-CM
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
## 2013-09-25 NOTE — Patient Instructions (Signed)
Take the Naprosyn twice daily with breakfast and supper on a regular basis for a few more days until your pain has subsided  Take the muscle relaxant (methocarbamol) 3 or 4 times daily until you don't feel so tight  Use the hydrocodone/acetaminophen one half to one tablet only when needed for severe pain.

## 2013-10-10 ENCOUNTER — Ambulatory Visit (AMBULATORY_SURGERY_CENTER): Payer: Self-pay | Admitting: *Deleted

## 2013-10-10 VITALS — Ht 70.0 in | Wt 153.6 lb

## 2013-10-10 DIAGNOSIS — Z1211 Encounter for screening for malignant neoplasm of colon: Secondary | ICD-10-CM

## 2013-10-10 MED ORDER — MOVIPREP 100 G PO SOLR: 1.0000 | Freq: Once | ORAL | Status: DC

## 2013-10-10 NOTE — Progress Notes (Signed)
No egg or soy allergy. No anesthesia problems.  

## 2013-10-14 ENCOUNTER — Encounter: Payer: Self-pay | Admitting: Internal Medicine

## 2013-10-29 ENCOUNTER — Encounter: Payer: Self-pay | Admitting: Internal Medicine

## 2013-10-29 ENCOUNTER — Ambulatory Visit (AMBULATORY_SURGERY_CENTER): Payer: Medicare Other | Admitting: Internal Medicine

## 2013-10-29 ENCOUNTER — Other Ambulatory Visit: Payer: Self-pay | Admitting: Internal Medicine

## 2013-10-29 VITALS — BP 126/77 | HR 60 | Temp 97.2°F | Resp 21 | Ht 70.0 in | Wt 153.0 lb

## 2013-10-29 DIAGNOSIS — Z1211 Encounter for screening for malignant neoplasm of colon: Secondary | ICD-10-CM

## 2013-10-29 DIAGNOSIS — D126 Benign neoplasm of colon, unspecified: Secondary | ICD-10-CM

## 2013-10-29 DIAGNOSIS — Z800 Family history of malignant neoplasm of digestive organs: Secondary | ICD-10-CM

## 2013-10-29 MED ORDER — SODIUM CHLORIDE 0.9 % IV SOLN: 500.0000 mL | INTRAVENOUS | Status: DC
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
## 2013-10-29 NOTE — Patient Instructions (Signed)

## 2013-10-29 NOTE — Progress Notes (Signed)
Called to room to assist during endoscopic procedure.  Patient ID and intended procedure confirmed with present staff. Received instructions for my participation in the procedure from the performing physician.  

## 2013-10-29 NOTE — Op Note (Signed)
[facility] [address] [place], [address]   COLONOSCOPY PROCEDURE REPORT  PATIENT: Ronald Davenport, Ronald Davenport  MR#: [id number] BIRTHDATE: 08-14-1951 , 62  yrs. old GENDER: Male ENDOSCOPIST: Beverley Fiedler, MD REFERRED NW:GNFAOZ Katrinka Blazing, M.D. PROCEDURE DATE:  10/29/2013 PROCEDURE:   Colonoscopy with cold biopsy polypectomy and Colonoscopy with snare polypectomy First Screening Colonoscopy - Avg.  risk and is 50 yrs.  old or older - No.  Prior Negative Screening - Now for repeat screening. 10 or more years since last screening  History of Adenoma - Now for follow-up colonoscopy & has been > or = to 3 yrs.  N/A  Polyps Removed Today? Yes. ASA CLASS:   Class II INDICATIONS:elevated risk screening and Patient's immediate family history of colon cancer. MEDICATIONS: MAC sedation, administered by CRNA, Propofol (Diprivan), and Propofol (Diprivan) 340 mg IV  DESCRIPTION OF PROCEDURE:   After the risks benefits and alternatives of the procedure were thoroughly explained, informed consent was obtained.  A digital rectal exam revealed no rectal mass.   The LB [id number]  endoscope was introduced through the anus and advanced to the cecum, which was identified by both the appendix and ileocecal valve. No adverse events experienced. The quality of the prep was good, using MoviPrep  The instrument was then slowly withdrawn as the colon was fully examined.   COLON FINDINGS: Three sessile polyps ranging between 3-71mm in size were found at the cecum, in the ascending colon, and descending colon.  Polypectomy was performed with cold forceps (2) and using cold snare (1).  All resections were complete and all polyp tissue was completely retrieved.   Moderate diverticulosis was noted in the descending colon and sigmoid colon.  Retroflexed views revealed small external hemorrhoids. The time to cecum=2 minutes 41 seconds. Withdrawal time=12 minutes 17 seconds.  The scope was  withdrawn and the procedure completed. COMPLICATIONS: There were no complications.  ENDOSCOPIC IMPRESSION: 1.   Three sessile polyps ranging between 3-68mm in size were found at the cecum, in the ascending colon, and descending colon; Polypectomy was performed with cold forceps and using cold snare 2.   Moderate diverticulosis was noted in the descending colon and sigmoid colon  RECOMMENDATIONS: 1.  Await pathology results 2.  High fiber diet 3.  Timing of repeat colonoscopy will be determined by pathology findings. 4.  You will receive a letter within 1-2 weeks with the results of your biopsy as well as final recommendations.  Please call my office if you have not received a letter after 3 weeks.   eSigned:  Beverley Fiedler, MD 10/29/2013 9:34 AM     cc: The Patient and Nilda Simmer, MD

## 2013-10-29 NOTE — Progress Notes (Signed)
Report to pacu rn, vss, bbs=clear 

## 2013-10-30 ENCOUNTER — Telehealth: Payer: Self-pay | Admitting: *Deleted

## 2013-10-30 NOTE — Telephone Encounter (Signed)
  Follow up Call-  Call back number 10/29/2013  Post procedure Call Back phone  # [phone]   Permission to leave phone message Yes     Patient questions:  Do you have a fever, pain , or abdominal swelling? no Pain Score  0 *  Have you tolerated food without any problems? yes  Have you been able to return to your normal activities? yes  Do you have any questions about your discharge instructions: Diet   no Medications  no Follow up visit  no  Do you have questions or concerns about your Care? no  Actions: * If pain score is 4 or above: No action needed, pain <4.

## 2013-11-03 ENCOUNTER — Encounter: Payer: Self-pay | Admitting: Internal Medicine

## 2013-11-05 ENCOUNTER — Telehealth: Payer: Self-pay

## 2013-11-05 NOTE — Telephone Encounter (Signed)
Pharm reqs a RF of pt's medication formerly Rxd by Dr Chalmers Guest. Rx is Proscar 5 mg tab QD. Dr Katrinka Blazing it looks as if you are the only provider here who has seen pt for anything related. Do you want to Rx for pt, or does it need to go through urologist?

## 2013-11-05 NOTE — Telephone Encounter (Signed)
Recommend that patient get refills from urology; he has a known elevation of PSA which urology is apparently following; I did obtain his PSA for him in 07/2013 but I want him to follow-up with urology and he is likely due for follow-up with them if his rx is out.

## 2013-11-06 NOTE — Telephone Encounter (Signed)
Called pharm and spoke to pharmacist requesting they send RF req to urologist. They agreed.

## 2013-12-15 ENCOUNTER — Ambulatory Visit (INDEPENDENT_AMBULATORY_CARE_PROVIDER_SITE_OTHER): Payer: Medicare Other | Admitting: Family Medicine

## 2013-12-15 VITALS — BP 122/76 | HR 75 | Temp 98.6°F | Resp 17 | Ht 67.5 in | Wt 164.0 lb

## 2013-12-15 DIAGNOSIS — M7918 Myalgia, other site: Secondary | ICD-10-CM

## 2013-12-15 DIAGNOSIS — M5380 Other specified dorsopathies, site unspecified: Secondary | ICD-10-CM

## 2013-12-15 DIAGNOSIS — M62830 Muscle spasm of back: Secondary | ICD-10-CM

## 2013-12-15 DIAGNOSIS — IMO0001 Reserved for inherently not codable concepts without codable children: Secondary | ICD-10-CM

## 2013-12-15 DIAGNOSIS — L259 Unspecified contact dermatitis, unspecified cause: Secondary | ICD-10-CM

## 2013-12-15 DIAGNOSIS — L309 Dermatitis, unspecified: Secondary | ICD-10-CM

## 2013-12-15 MED ORDER — TRIAMCINOLONE ACETONIDE 0.1 % EX CREA: 1.0000 "application " | TOPICAL_CREAM | Freq: Two times a day (BID) | CUTANEOUS | Status: DC

## 2013-12-15 MED ORDER — METHOCARBAMOL 500 MG PO TABS: 500.0000 mg | ORAL_TABLET | Freq: Four times a day (QID) | ORAL | Status: DC | PRN
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
## 2014-08-28 ENCOUNTER — Ambulatory Visit (INDEPENDENT_AMBULATORY_CARE_PROVIDER_SITE_OTHER): Payer: Medicare Other | Admitting: Family Medicine

## 2014-08-28 VITALS — BP 128/74 | HR 60 | Temp 97.8°F | Resp 16 | Ht 67.0 in | Wt 145.0 lb

## 2014-08-28 DIAGNOSIS — R109 Unspecified abdominal pain: Secondary | ICD-10-CM

## 2014-08-28 DIAGNOSIS — M79604 Pain in right leg: Secondary | ICD-10-CM

## 2014-08-28 DIAGNOSIS — M549 Dorsalgia, unspecified: Secondary | ICD-10-CM

## 2014-08-28 LAB — POCT UA - MICROSCOPIC ONLY
Bacteria, U Microscopic: NEGATIVE
CASTS, UR, LPF, POC: NEGATIVE
CRYSTALS, UR, HPF, POC: NEGATIVE
RBC, urine, microscopic: NEGATIVE
YEAST UA: NEGATIVE

## 2014-08-28 LAB — POCT URINALYSIS DIPSTICK
Bilirubin, UA: NEGATIVE
GLUCOSE UA: NEGATIVE
Ketones, UA: NEGATIVE
NITRITE UA: NEGATIVE
PROTEIN UA: NEGATIVE
Spec Grav, UA: 1.02
UROBILINOGEN UA: 0.2
pH, UA: 7

## 2014-08-28 MED ORDER — METHOCARBAMOL 500 MG PO TABS: 500.0000 mg | ORAL_TABLET | Freq: Four times a day (QID) | ORAL | Status: DC | PRN

## 2014-08-28 NOTE — Patient Instructions (Signed)
Let us know if you do not feel better soon!

## 2014-08-28 NOTE — Progress Notes (Signed)
[facility] [address] [place] [address] [id number] [phone]  Date:  08/28/2014   Name:  Ronald Davenport   DOB:  27-Jan-1951   MRN:  [id number]  PCP:  No PCP Per Patient    Chief Complaint: Back Pain   History of Present Illness:  Ronald Davenport is a 63 y.o. very pleasant male patient who presents with the following:  He has noted pain in the right lower back- this has been present for about 2 days. His main concern is that his kidneys are ok.   No blood in his urine.  He has never had a kidney stone in the past.  No other urinary sx.  No bowel or bladder control problems.   The pain can go down his right leg and the foot can be numb sometimes but not constantly.  He actually has a history of chronic back problems and has had epidural steroid injections per his pain management doctor.    He is otherwise healthy   Patient Active Problem List   Diagnosis Date Noted  . Elevated PSA 02/12/2013    Past Medical History  Diagnosis Date  . Arthritis     Past Surgical History  Procedure Laterality Date  . Cataract extraction Left 2006    History  Substance Use Topics  . Smoking status: Never Smoker   . Smokeless tobacco: Never Used  . Alcohol Use: Yes     Comment: occasionally     Family History  Problem Relation Age of Onset  . Colon cancer Brother     No Known Allergies  Medication list has been reviewed and updated.  No current outpatient prescriptions on file prior to visit.   No current facility-administered medications on file prior to visit.    Review of Systems:  As per HPI- otherwise negative.   Physical Examination: Filed Vitals:   08/28/14 1830  BP: 128/74  Pulse: 60  Temp: 97.8 F (36.6 C)  Resp: 16   Filed Vitals:   08/28/14 1830  Height: 5\' 7"  (1.702 m)  Weight: 145 lb (65.772 kg)   Body mass index is 22.71 kg/(m^2). Ideal Body Weight: Weight in (lb) to have BMI = 25: 159.3  GEN: WDWN, NAD, Non-toxic, A & O x  3, looks well, slim build HEENT: Atraumatic, Normocephalic. Neck supple. No masses, No LAD. Ears and Nose: No external deformity. CV: RRR, No M/G/R. No JVD. No thrill. No extra heart sounds. PULM: CTA B, no wheezes, crackles, rhonchi. No retractions. No resp. distress. No accessory muscle use. EXTR: No c/c/e NEURO Normal gait.  PSYCH: Normally interactive. Conversant. Not depressed or anxious appearing.  Calm demeanor.  He is tender over the right SI joint, and has some spasm in the right lower back.  There is weakness in the right leg which he states is chronic, no numbness and no saddle anesthesia.  Patellar reflexes are diminished bilaterally  Normal flexion of the spine, stiffness with extension   Results for orders placed in visit on 08/28/14  POCT UA - MICROSCOPIC ONLY      Result Value Ref Range   WBC, Ur, HPF, POC 0-2     RBC, urine, microscopic neg     Bacteria, U Microscopic neg     Mucus, UA trace     Epithelial cells, urine per micros 0-1     Crystals, Ur, HPF, POC neg     Casts, Ur, LPF, POC neg  Yeast, UA neg    POCT URINALYSIS DIPSTICK      Result Value Ref Range   Color, UA yellow     Clarity, UA clear     Glucose, UA neg     Bilirubin, UA neg     Ketones, UA neg     Spec Grav, UA 1.020     Blood, UA small     pH, UA 7.0     Protein, UA neg     Urobilinogen, UA 0.2     Nitrite, UA neg     Leukocytes, UA Trace      Assessment and Plan: Right flank pain - Plan: POCT UA - Microscopic Only, POCT urinalysis dipstick, methocarbamol (ROBAXIN) 500 MG tablet  Pain of back and right lower extremity - Plan: methocarbamol (ROBAXIN) 500 MG tablet  Lower back pain- likely due to muscle spasm in a pt with chronic back problems at baseline.  He has used robaxin in the past with success and would like some of this.  Reassured that his urine does not suggest a kidney stone.  He will follow-up if not feeling better soon  Signed Lamar Blinks, MD

## 2014-09-07 ENCOUNTER — Encounter: Payer: Self-pay | Admitting: Family Medicine

## 2014-09-07 DIAGNOSIS — N400 Enlarged prostate without lower urinary tract symptoms: Secondary | ICD-10-CM | POA: Insufficient documentation

## 2014-09-17 ENCOUNTER — Ambulatory Visit (INDEPENDENT_AMBULATORY_CARE_PROVIDER_SITE_OTHER): Payer: Medicare Other | Admitting: Family Medicine

## 2014-09-17 VITALS — BP 130/70 | HR 66 | Temp 97.8°F | Resp 20 | Ht 67.0 in | Wt 151.2 lb

## 2014-09-17 DIAGNOSIS — R110 Nausea: Secondary | ICD-10-CM

## 2014-09-17 DIAGNOSIS — R319 Hematuria, unspecified: Secondary | ICD-10-CM

## 2014-09-17 LAB — POCT UA - MICROSCOPIC ONLY
Casts, Ur, LPF, POC: NEGATIVE
Crystals, Ur, HPF, POC: NEGATIVE
Mucus, UA: NEGATIVE
YEAST UA: NEGATIVE

## 2014-09-17 LAB — POCT URINALYSIS DIPSTICK
Bilirubin, UA: NEGATIVE
Glucose, UA: NEGATIVE
Ketones, UA: NEGATIVE
LEUKOCYTES UA: NEGATIVE
NITRITE UA: NEGATIVE
PH UA: 7
PROTEIN UA: NEGATIVE
Spec Grav, UA: 1.015
UROBILINOGEN UA: 0.2

## 2014-09-17 MED ORDER — PANTOPRAZOLE SODIUM 40 MG PO TBEC: 40.0000 mg | DELAYED_RELEASE_TABLET | Freq: Every day | ORAL | Status: DC

## 2014-09-17 NOTE — Patient Instructions (Signed)
Stop taking the Motrin.  This could be upsetting your stomach.  Start taking the Protonix.  This should really help with the acid production in your stomach and the nausea.  Give this a couple of weeks to work.  If not better, come back and see Ronald Davenport.    It was good to meet you today.  We will call if you have any problems with your blood tests.

## 2014-09-17 NOTE — Progress Notes (Signed)
Ronald Davenport is a 63 y.o. male who presents to [facility] today with complaints of nausea:  1.  Nausea:  Present for about 1 month.  States that he began having nausea late September.  Bitter taste in mouth after food.  Describes Presented to primary care doc and given Phenergan without diagnosis.  Helped nausea while he took meds (8 pills) and then recurred after finishing.  Went back 2nd time and given 8 more pills of nausea.  Some relief while he had the pills.    Recurred again, went back 3rd time.  Given Cipro 500 mg daily, # 60 pills.  States this not helping.  Gave him headaches, told to take Motrin but this made nausea worst.  Denies any abdominal pain.  No actual vomiting.    Subjective weight loss for past several weeks -- but on review no change in weight.    PMH signficant for hematuria.  Followed by Urology.  Had PSA >10, negative biopsy.  Last seen early October 2015.  Prescribed Finasteride which has lowered PSA to 5.     PMH reviewed.  Past Medical History  Diagnosis Date  . Arthritis    Past Surgical History  Procedure Laterality Date  . Cataract extraction Left 2006    Medications reviewed. Current Outpatient Prescriptions  Medication Sig Dispense Refill  . methocarbamol (ROBAXIN) 500 MG tablet Take 1 tablet (500 mg total) by mouth every 6 (six) hours as needed for muscle spasms.  60 tablet  0   No current facility-administered medications for this visit.    ROS as above otherwise neg.  No chest pain, palpitations, SOB, Fever, Chills, Abd pain, N/V/D.   Physical Exam:  BP 130/70  Pulse 66  Temp(Src) 97.8 F (36.6 C) (Oral)  Resp 20  Ht 5\' 7"  (1.702 m)  Wt 151 lb 4 oz (68.607 kg)  BMI 23.68 kg/m2  SpO2 98% Gen:  Alert, cooperative patient who appears stated age in no acute distress.  Vital signs reviewed. HEENT: EOMI.  Right cataract noted.  Left eye s/p cataract repair.  MMM Pulm:  Clear to auscultation bilaterally Cardiac:  Regular rate and rhythm  with slight Grade I murmur noted Abd:  Soft/nondistended/nontender throughout.  No guarding/rebound. Exts: Thin  Results for orders placed in visit on 09/17/14  POCT URINALYSIS DIPSTICK      Result Value Ref Range   Color, UA yellow     Clarity, UA clear     Glucose, UA neg     Bilirubin, UA neg     Ketones, UA neg     Spec Grav, UA 1.015     Blood, UA small     pH, UA 7.0     Protein, UA neg     Urobilinogen, UA 0.2     Nitrite, UA neg     Leukocytes, UA Negative      Assessment and Plan:  1.  GERD: - most likely diagnosis with persistent nausea and brash minimally improved with Phenergan.  Already treated for any infectious source with Cipro - Protonix to treat.  Return to clinic if no improvements.  Stop Motrin - Tylenol for pain relief.    2.  Hematuria: - persistent for years - obtained UA after finding hematuria but prior to being told he had already been evaluated at Urology.  - COntinue usual 3 month follow ups with Urology. -

## 2014-09-18 LAB — CBC WITH DIFFERENTIAL/PLATELET
BASOS ABS: 0 10*3/uL (ref 0.0–0.1)
BASOS PCT: 1 % (ref 0–1)
EOS PCT: 1 % (ref 0–5)
Eosinophils Absolute: 0 10*3/uL (ref 0.0–0.7)
HEMATOCRIT: 40.5 % (ref 39.0–52.0)
HEMOGLOBIN: 13.8 g/dL (ref 13.0–17.0)
Lymphocytes Relative: 36 % (ref 12–46)
Lymphs Abs: 1.5 10*3/uL (ref 0.7–4.0)
MCH: 29.4 pg (ref 26.0–34.0)
MCHC: 34.1 g/dL (ref 30.0–36.0)
MCV: 86.2 fL (ref 78.0–100.0)
Monocytes Absolute: 0.4 10*3/uL (ref 0.1–1.0)
Monocytes Relative: 9 % (ref 3–12)
Neutro Abs: 2.2 10*3/uL (ref 1.7–7.7)
Neutrophils Relative %: 53 % (ref 43–77)
Platelets: 219 10*3/uL (ref 150–400)
RBC: 4.7 MIL/uL (ref 4.22–5.81)
RDW: 14.7 % (ref 11.5–15.5)
WBC: 4.2 10*3/uL (ref 4.0–10.5)

## 2014-09-18 LAB — COMPREHENSIVE METABOLIC PANEL
ALT: 13 U/L (ref 0–53)
AST: 17 U/L (ref 0–37)
Albumin: 4.1 g/dL (ref 3.5–5.2)
Alkaline Phosphatase: 79 U/L (ref 39–117)
BUN: 15 mg/dL (ref 6–23)
CALCIUM: 8.9 mg/dL (ref 8.4–10.5)
CHLORIDE: 102 meq/L (ref 96–112)
CO2: 28 meq/L (ref 19–32)
CREATININE: 1.04 mg/dL (ref 0.50–1.35)
Glucose, Bld: 89 mg/dL (ref 70–99)
Potassium: 4.2 mEq/L (ref 3.5–5.3)
SODIUM: 136 meq/L (ref 135–145)
Total Bilirubin: 0.6 mg/dL (ref 0.2–1.2)
Total Protein: 6.7 g/dL (ref 6.0–8.3)

## 2014-09-21 ENCOUNTER — Telehealth: Payer: Self-pay

## 2014-09-21 DIAGNOSIS — R110 Nausea: Secondary | ICD-10-CM

## 2014-09-21 NOTE — Telephone Encounter (Signed)
Spoke with pt. He says that the med prescribed (protonix) is not helping at all. He is still very nauseous and is unable to eat. Please advise. Thanks

## 2014-09-21 NOTE — Telephone Encounter (Signed)
Pt.notified

## 2014-09-21 NOTE — Telephone Encounter (Signed)
Message copied by Constance Goltz on Mon Sep 21, 2014  2:34 PM ------      Message from: Mingo Amber, Dub[place] H      Created: Mon Sep 21, 2014  2:00 PM      Regarding: Lab results       Hello,            Please call this patient to let him know that all of his blood work has returned and is normal.  Let me know if you have any questions,            Thanks,            Annabell Sabal  ------

## 2014-09-21 NOTE — Telephone Encounter (Signed)
Advise patient to continue Protonix (it can take 10-14 days to become effective).  OK to add ondansetron 4 mg, 1 PO Q8 hours prn nausea, #30, no refills.  Refer to GI for evaluation, dx: nausea.

## 2014-09-21 NOTE — Telephone Encounter (Signed)
LM for rtn call. Labs were normal.

## 2014-09-22 MED ORDER — ONDANSETRON HCL 4 MG PO TABS: 4.0000 mg | ORAL_TABLET | Freq: Three times a day (TID) | ORAL | Status: DC | PRN

## 2014-09-22 NOTE — Telephone Encounter (Signed)
Patient called returning call. Please return call and advise.

## 2014-09-22 NOTE — Telephone Encounter (Signed)
Pt.notified

## 2014-09-22 NOTE — Telephone Encounter (Signed)
LM to rtn call  Medication and Referral sent as directed below.

## 2014-11-27 ENCOUNTER — Ambulatory Visit (INDEPENDENT_AMBULATORY_CARE_PROVIDER_SITE_OTHER): Payer: Medicare Other | Admitting: Internal Medicine

## 2014-11-27 VITALS — BP 130/74 | HR 63 | Temp 97.2°F | Ht 67.0 in | Wt 153.4 lb

## 2014-11-27 DIAGNOSIS — R519 Headache, unspecified: Secondary | ICD-10-CM

## 2014-11-27 DIAGNOSIS — G8929 Other chronic pain: Secondary | ICD-10-CM | POA: Insufficient documentation

## 2014-11-27 DIAGNOSIS — R51 Headache: Secondary | ICD-10-CM

## 2014-11-27 DIAGNOSIS — R319 Hematuria, unspecified: Secondary | ICD-10-CM

## 2014-11-27 DIAGNOSIS — M549 Dorsalgia, unspecified: Secondary | ICD-10-CM

## 2014-11-27 DIAGNOSIS — K219 Gastro-esophageal reflux disease without esophagitis: Secondary | ICD-10-CM

## 2014-11-27 LAB — POCT URINALYSIS DIPSTICK
Bilirubin, UA: NEGATIVE
Glucose, UA: NEGATIVE
Ketones, UA: NEGATIVE
Nitrite, UA: NEGATIVE
PH UA: 8.5
Protein, UA: 30
Spec Grav, UA: 1.015
UROBILINOGEN UA: 0.2

## 2014-11-27 LAB — POCT UA - MICROSCOPIC ONLY
CASTS, UR, LPF, POC: NEGATIVE
Crystals, Ur, HPF, POC: NEGATIVE
Mucus, UA: NEGATIVE
Yeast, UA: NEGATIVE

## 2014-11-27 NOTE — Progress Notes (Addendum)
Subjective:  This chart was scribed for [name], MD by Dellis Filbert, ED Scribe at [facility].The patient was seen in exam room 01 and the patient's care was started at 2:00 PM.   Patient ID: Ronald Davenport, male    DOB: 04/23/1951, 64 y.o.   MRN: [id number] Chief Complaint  Patient presents with  . Headache    felt in the center of his head   HPI  HPI Comments: Ronald Davenport is a 64 y.o. male who presents to [facility] complaining of HA, onset two days ago. He denies neck pain, blurry vision and dizziness. Headache is over the crown. Comes and goes. Does not interfere with activity.   Pt is also concerned about elevated blood pressure. He was scheduled an epidural injection for 3 days ago but this was canceled because of elevated blood pressure. Pt went to [employer] that night and  He did take wal-flu for a cold.  Pt also complains of hematuria recently. He denies dysuria and decreased urinary flow. Note followed at urology for benign hematuria, BPH, and elevated PSA. No fever flank pain. No history of stones.   Patient Active Problem List   Diagnosis Date Noted  . BPH (benign prostatic hyperplasia) 09/07/2014  . Elevated PSA 02/12/2013   Past Medical History  Diagnosis Date  . Arthritis    Past Surgical History  Procedure Laterality Date  . Cataract extraction Left 2006   No Known Allergies Prior to Admission medications   Medication Sig Start Date End Date Taking? Authorizing Provider  methocarbamol (ROBAXIN) 500 MG tablet Take 1 tablet (500 mg total) by mouth every 6 (six) hours as needed for muscle spasms. 08/28/14  Yes Gay Filler Copland, MD  ondansetron (ZOFRAN) 4 MG tablet Take 1 tablet (4 mg total) by mouth every 8 (eight) hours as needed for nausea or vomiting. Patient not taking: Reported on 11/27/2014 09/22/14   Chelle S Jeffery, PA-C  pantoprazole (PROTONIX) 40 MG tablet Take 1 tablet (40 mg total) by mouth daily. Patient not taking: Reported  on 11/27/2014 09/17/14   Alveda Reasons, MD   History   Social History  . Marital Status: Married    Spouse Name: N/A    Number of Children: N/A  . Years of Education: N/A   Occupational History  . disability     DDD lumbar/chronic back pain   Social History Main Topics  . Smoking status: Never Smoker   . Smokeless tobacco: Never Used  . Alcohol Use: 0.0 oz/week    0 Not specified per week     Comment: occasionally   . Drug Use: No  . Sexual Activity: Yes   Other Topics Concern  . Not on file   Social History Narrative   From [place]; [place] since 1980s.   Review of Systems  Eyes: Negative for visual disturbance.  Genitourinary: Positive for hematuria. Negative for dysuria, decreased urine volume and difficulty urinating.  Musculoskeletal: Negative for neck pain.  Neurological: Positive for headaches. Negative for dizziness.      Objective:  BP 130/74 mmHg  Pulse 63  Temp(Src) 97.2 F (36.2 C) (Oral)  Ht 5\' 7"  (1.702 m)  Wt 153 lb 6.4 oz (69.582 kg)  BMI 24.02 kg/m2  SpO2 98%  Physical Exam  Constitutional: He is oriented to person, place, and time. He appears well-developed and well-nourished. No distress.  HENT:  Head: Normocephalic and atraumatic.  Mouth/Throat: Oropharynx is clear and moist.  Eyes:  Conjunctivae and EOM are normal. Pupils are equal, round, and reactive to light.  Neck: Normal range of motion. Neck supple. No thyromegaly present.  Cardiovascular: Normal rate, regular rhythm and normal heart sounds.   No murmur heard. Pulmonary/Chest: Effort normal.  Musculoskeletal: Normal range of motion.  Lymphadenopathy:    He has no cervical adenopathy.  Neurological: He is alert and oriented to person, place, and time. He has normal reflexes. No cranial nerve deficit. Coordination normal.  Skin: Skin is warm and dry.  Psychiatric: He has a normal mood and affect. His behavior is normal. Thought content normal.  Nursing note and vitals reviewed.      Results for orders placed or performed in visit on 11/27/14  POCT UA - Microscopic Only  Result Value Ref Range   WBC, Ur, HPF, POC 1-2    RBC, urine, microscopic 4-8    Bacteria, U Microscopic small    Mucus, UA neg    Epithelial cells, urine per micros 0-1    Crystals, Ur, HPF, POC neg    Casts, Ur, LPF, POC neg    Yeast, UA neg   POCT urinalysis dipstick  Result Value Ref Range   Color, UA yellow    Clarity, UA cloudy    Glucose, UA neg    Bilirubin, UA neg    Ketones, UA neg    Spec Grav, UA 1.015    Blood, UA modrate    pH, UA 8.5    Protein, UA 30    Urobilinogen, UA 0.2    Nitrite, UA neg    Leukocytes, UA Trace     Assessment & Plan:  Hematuria--- benign///culture sent Headache--tension type with normal neurological/Tylenol Hx elevated PSA/BPH-followed by urology Reassured regarding blood pressure    I have completed the patient encounter in its entirety as documented by the scribe, with editing by me where necessary. Robert P. Laney Pastor, M.D.

## 2014-11-28 DIAGNOSIS — K219 Gastro-esophageal reflux disease without esophagitis: Secondary | ICD-10-CM | POA: Insufficient documentation

## 2014-11-28 LAB — URINE CULTURE
Colony Count: NO GROWTH
ORGANISM ID, BACTERIA: NO GROWTH

## 2015-01-25 ENCOUNTER — Encounter: Payer: Self-pay | Admitting: *Deleted

## 2015-03-20 ENCOUNTER — Ambulatory Visit (INDEPENDENT_AMBULATORY_CARE_PROVIDER_SITE_OTHER): Payer: Medicare Other | Admitting: Internal Medicine

## 2015-03-20 VITALS — BP 154/86 | HR 61 | Temp 98.2°F | Resp 16 | Ht 67.0 in | Wt 157.2 lb

## 2015-03-20 DIAGNOSIS — R030 Elevated blood-pressure reading, without diagnosis of hypertension: Secondary | ICD-10-CM

## 2015-03-20 DIAGNOSIS — R319 Hematuria, unspecified: Secondary | ICD-10-CM

## 2015-03-20 DIAGNOSIS — R310 Gross hematuria: Secondary | ICD-10-CM

## 2015-03-20 LAB — POCT UA - MICROSCOPIC ONLY
Bacteria, U Microscopic: NEGATIVE
CASTS, UR, LPF, POC: NEGATIVE
CRYSTALS, UR, HPF, POC: NEGATIVE
Mucus, UA: NEGATIVE
YEAST UA: NEGATIVE

## 2015-03-20 LAB — POCT URINALYSIS DIPSTICK
Bilirubin, UA: NEGATIVE
Glucose, UA: NEGATIVE
KETONES UA: NEGATIVE
Leukocytes, UA: NEGATIVE
Nitrite, UA: NEGATIVE
PH UA: 7.5
PROTEIN UA: NEGATIVE
SPEC GRAV UA: 1.02
UROBILINOGEN UA: 0.2

## 2015-03-20 MED ORDER — DOXYCYCLINE HYCLATE 100 MG PO TABS: 100.0000 mg | ORAL_TABLET | Freq: Two times a day (BID) | ORAL | Status: DC

## 2015-03-20 NOTE — Progress Notes (Signed)
Subjective:  This chart was scribed for [name], MD by Mercy Moore, Medial Scribe. This patient was seen in room 13 and the patient's care was started at 3:52 PM.    Patient ID: Gershon Crane, male    DOB: 03-29-1951, 64 y.o.   MRN: [id number] Chief Complaint  Patient presents with  . Hematuria    pt started last night with blood in urine. denies any abd or back pain    Hematuria Associated symptoms include dysuria. Pertinent negatives include no abdominal pain or fever.   HPI Comments: Kedarius Aloisi is a 64 y.o. male with PMHx of benign prostatic hyperplasia who presents to the [facility] complaining of recurrent hematuria and dsyuria last night. Patient reports three episodes last night; patient denies nocturia stating he went to asleep around 5am. Patient reports being prescribed antibiotics by a urologist for his symptoms after being referred during his January 2016 visit with [place]. He responded at that time. He failed ago for follow-up as suggested in their consult note. Last night he had gross hematuria 3 times in his dysuria if any was exceedingly mild. He had no flank pain or belly pain. He had no fever. He had no testicular pain.   Patient Active Problem List   Diagnosis Date Noted  . GERD (gastroesophageal reflux disease) 11/28/2014  . Chronic back pain 11/27/2014  . BPH (benign prostatic hyperplasia) 09/07/2014  . Elevated PSA 02/12/2013   Past Medical History  Diagnosis Date  . Arthritis    Past Surgical History  Procedure Laterality Date  . Cataract extraction Left 2006   No Known Allergies Prior to Admission medications   Medication Sig Start Date End Date Taking? Authorizing Provider  finasteride (PROSCAR) 5 MG tablet Take 5 mg by mouth daily.   Yes Historical Provider, MD  methocarbamol (ROBAXIN) 500 MG tablet Take 1 tablet (500 mg total) by mouth every 6 (six) hours as needed for muscle spasms. Patient not taking: Reported on  03/20/2015 08/28/14   Gay Filler Copland, MD  ondansetron (ZOFRAN) 4 MG tablet Take 1 tablet (4 mg total) by mouth every 8 (eight) hours as needed for nausea or vomiting. Patient not taking: Reported on 11/27/2014 09/22/14   Chelle S Jeffery, PA-C  pantoprazole (PROTONIX) 40 MG tablet Take 1 tablet (40 mg total) by mouth daily. Patient not taking: Reported on 11/27/2014 09/17/14   Alveda Reasons, MD   History   Social History  . Marital Status: Married    Spouse Name: N/A  . Number of Children: N/A  . Years of Education: N/A   Occupational History  . disability     DDD lumbar/chronic back pain   Social History Main Topics  . Smoking status: Never Smoker   . Smokeless tobacco: Never Used  . Alcohol Use: 0.0 oz/week    0 Standard drinks or equivalent per week     Comment: occasionally   . Drug Use: No  . Sexual Activity: Yes   Other Topics Concern  . Not on file   Social History Narrative   From [place]; [place] since 1980s.      Review of Systems  Constitutional: Negative for fever.  Gastrointestinal: Negative for abdominal pain.  Genitourinary: Positive for dysuria and hematuria.  Musculoskeletal: Negative for back pain.       Objective:   Physical Exam  Constitutional: He is oriented to person, place, and time. He appears well-developed and well-nourished. No distress.  Eyes: EOM  are normal. Pupils are equal, round, and reactive to light.  Neck: Neck supple.  Cardiovascular: Normal rate.   Pulmonary/Chest: Effort normal.  Abdominal: There is no tenderness.  No flank tenderness to percussion  Musculoskeletal: Normal range of motion.  Neurological: He is alert and oriented to person, place, and time.  Psychiatric: He has a normal mood and affect.    Filed Vitals:   03/20/15 1539  BP: 154/86  Pulse: 61  Temp: 98.2 F (36.8 C)  TempSrc: Oral  Resp: 16  Height: 5\' 7"  (1.702 m)  Weight: 157 lb 4 oz (71.328 kg)  SpO2: 96%   Results for orders placed or performed  in visit on 03/20/15  POCT urinalysis dipstick  Result Value Ref Range   Color, UA yellow    Clarity, UA clear    Glucose, UA negative    Bilirubin, UA negative    Ketones, UA negative    Spec Grav, UA 1.020    Blood, UA trace-intact    pH, UA 7.5    Protein, UA negative    Urobilinogen, UA 0.2    Nitrite, UA negative    Leukocytes, UA Negative   POCT UA - Microscopic Only  Result Value Ref Range   WBC, Ur, HPF, POC 0-1    RBC, urine, microscopic 1-3    Bacteria, U Microscopic negative    Mucus, UA negative    Epithelial cells, urine per micros 0-1    Crystals, Ur, HPF, POC negative    Casts, Ur, LPF, POC negative    Yeast, UA negative          Assessment & Plan:  Gross hematuria - Plan: Ambulatory referral to Urology for f/u, ? Cystoscopy  Past history of hematuria Hematuria - Plan: Urine culture  Elevated blood pressure reading without diagnosis of hypertension--follow-up for routine care   He was most comfortable with taking another round of the same antibiotic given him by urology before he followed up with him Meds ordered this encounter  Medications  . doxycycline (VIBRA-TABS) 100 MG tablet    Sig: Take 1 tablet (100 mg total) by mouth 2 (two) times daily.    Dispense:  14 tablet    Refill:  0    I have completed the patient encounter in its entirety as documented by the scribe, with editing by me where necessary. Tecia Cinnamon P. Laney Pastor, M.D.

## 2015-03-23 LAB — URINE CULTURE
COLONY COUNT: NO GROWTH
ORGANISM ID, BACTERIA: NO GROWTH

## 2015-03-24 ENCOUNTER — Encounter: Payer: Self-pay | Admitting: Family Medicine

## 2015-04-30 ENCOUNTER — Ambulatory Visit (INDEPENDENT_AMBULATORY_CARE_PROVIDER_SITE_OTHER): Payer: Medicare Other | Admitting: Family Medicine

## 2015-04-30 VITALS — BP 134/78 | HR 56 | Temp 97.6°F | Resp 18 | Ht 66.5 in | Wt 157.0 lb

## 2015-04-30 DIAGNOSIS — R319 Hematuria, unspecified: Secondary | ICD-10-CM

## 2015-04-30 DIAGNOSIS — R05 Cough: Secondary | ICD-10-CM | POA: Diagnosis not present

## 2015-04-30 DIAGNOSIS — J069 Acute upper respiratory infection, unspecified: Secondary | ICD-10-CM | POA: Diagnosis not present

## 2015-04-30 DIAGNOSIS — R059 Cough, unspecified: Secondary | ICD-10-CM

## 2015-04-30 LAB — POCT URINALYSIS DIPSTICK
BILIRUBIN UA: NEGATIVE
Glucose, UA: NEGATIVE
Ketones, UA: NEGATIVE
LEUKOCYTES UA: NEGATIVE
Nitrite, UA: NEGATIVE
PROTEIN UA: NEGATIVE
Spec Grav, UA: 1.02
Urobilinogen, UA: 1
pH, UA: 7

## 2015-04-30 LAB — POCT CBC
GRANULOCYTE PERCENT: 62.2 % (ref 37–80)
HEMATOCRIT: 42.7 % — AB (ref 43.5–53.7)
Hemoglobin: 13.6 g/dL — AB (ref 14.1–18.1)
Lymph, poc: 1.9 (ref 0.6–3.4)
MCH: 29.1 pg (ref 27–31.2)
MCHC: 31.8 g/dL (ref 31.8–35.4)
MCV: 91.7 fL (ref 80–97)
MID (cbc): 0.2 (ref 0–0.9)
MPV: 7.3 fL (ref 0–99.8)
POC Granulocyte: 3.4 (ref 2–6.9)
POC LYMPH PERCENT: 34.7 %L (ref 10–50)
POC MID %: 3.1 %M (ref 0–12)
Platelet Count, POC: 223 10*3/uL (ref 142–424)
RBC: 4.65 M/uL — AB (ref 4.69–6.13)
RDW, POC: 13.7 %
WBC: 5.4 10*3/uL (ref 4.6–10.2)

## 2015-04-30 LAB — POCT UA - MICROSCOPIC ONLY
Bacteria, U Microscopic: NEGATIVE
CASTS, UR, LPF, POC: NEGATIVE
Crystals, Ur, HPF, POC: NEGATIVE
Epithelial cells, urine per micros: NEGATIVE
Mucus, UA: NEGATIVE
Yeast, UA: NEGATIVE

## 2015-04-30 MED ORDER — BENZONATATE 100 MG PO CAPS: 100.0000 mg | ORAL_CAPSULE | Freq: Three times a day (TID) | ORAL | Status: DC | PRN

## 2015-04-30 NOTE — Progress Notes (Signed)
Subjective:  Patient ID: Ronald Davenport, male    DOB: 1951-03-29  Age: 64 y.o. MRN: [id number]  64 year old man who has a several day history of an upper respiratory infection. He's had a lot of head congestion and coughing. He does not smoke. Has not been febrile. He had some African-American medications with him, including some cough syrup which has not helped and he has taken a course of Coartem (Medication for malaria) he had not been to [place] for over a year. He has not had the fevers of malaria. He has a history of hematuria. Apparently after Dr. Laney Pastor saw him he was sent over to [facility]. He had a CT scan and a cystoscopy. He did not have any pathology found according to him. He has not been back yet for his follow-ups again. He has not been having any urinary pain but he did pass some blood the other day. He has a history of having done this a number of times in the past   Objective:   Alert and oriented. TMs normal. Throat clear. Neck supple without significant nodes. Chest clear to all station. Heart regular without murmurs. And soft without mass or tenderness. No CVA tenderness. He does hurt in his back, has a history of lumbar disc disease for which he is on disability.  Results for orders placed or performed in visit on 04/30/15  POCT UA - Microscopic Only  Result Value Ref Range   WBC, Ur, HPF, POC 0-1    RBC, urine, microscopic 3-5    Bacteria, U Microscopic neg    Mucus, UA neg    Epithelial cells, urine per micros neg    Crystals, Ur, HPF, POC neg    Casts, Ur, LPF, POC neg    Yeast, UA neg   POCT urinalysis dipstick  Result Value Ref Range   Color, UA yellow    Clarity, UA clear    Glucose, UA neg    Bilirubin, UA neg    Ketones, UA neg    Spec Grav, UA 1.020    Blood, UA moderate    pH, UA 7.0    Protein, UA neg    Urobilinogen, UA 1.0    Nitrite, UA neg    Leukocytes, UA Negative   POCT CBC  Result Value Ref Range   WBC 5.4 4.6 - 10.2 K/uL   Lymph, poc 1.9 0.6 - 3.4   POC LYMPH PERCENT 34.7 10 - 50 %L   MID (cbc) 0.2 0 - 0.9   POC MID % 3.1 0 - 12 %M   POC Granulocyte 3.4 2 - 6.9   Granulocyte percent 62.2 37 - 80 %G   RBC 4.65 (A) 4.69 - 6.13 M/uL   Hemoglobin 13.6 (A) 14.1 - 18.1 g/dL   HCT, POC 42.7 (A) 43.5 - 53.7 %   MCV 911.7 (A) 80 - 97 fL   MCH, POC 29.1 27 - 31.2 pg   MCHC 31.8 31.8 - 35.4 g/dL   RDW, POC 13.7 %   Platelet Count, POC 223 142 - 424 K/uL   MPV 7.3 0 - 99.8 fL     Assessment & Plan:   Assessment: Upper respiratory virus  Hematuria chronic low back pain secondary to lumbar disc disease    I explained to him that this was a viral cold, that his white blood count was normal, and he did not need in a box. Will treat with Mucinex DM, benzonatate, and Claritin-D. I told her  this would probably last 5-7 days, the cough might last longer. I do not believe that he needs anything done for the hematuria. He has been fully evaluated already. If it becomes gross hematuria he needs to return back to last urology.     Plan: Patient Instructions  Drink lots of water  If you start passing more blood at anytime come back. However I do not think a little occasionally is of any major concern in you since you have hard he been evaluated.  Take the benzonatate cough pills one or 2 pills 3 times a day if needed for cough  Take over-the-counter Mucinex DM according to the instructions on the container. (It calms as a syrup or as a pill).  Take over-the-counter Claritin-D (loratadine D) one pill daily for the head congestion and drainage  Return if more shortness of breath or cough is getting worse     HOPPER,DAVID, MD 04/30/2015

## 2015-04-30 NOTE — Patient Instructions (Addendum)
Drink lots of water  If you start passing more blood at anytime come back. However I do not think a little occasionally is of any major concern in you since you have hard he been evaluated.  Take the benzonatate cough pills one or 2 pills 3 times a day if needed for cough  Take over-the-counter Mucinex DM according to the instructions on the container. (It calms as a syrup or as a pill).  Take over-the-counter Claritin-D (loratadine D) one pill daily for the head congestion and drainage  Return if more shortness of breath or cough is getting worse

## 2015-05-27 ENCOUNTER — Ambulatory Visit (INDEPENDENT_AMBULATORY_CARE_PROVIDER_SITE_OTHER): Payer: Medicare Other | Admitting: Family Medicine

## 2015-05-27 VITALS — BP 120/72 | HR 58 | Temp 97.4°F | Resp 14 | Ht 67.0 in | Wt 154.4 lb

## 2015-05-27 DIAGNOSIS — R361 Hematospermia: Secondary | ICD-10-CM | POA: Diagnosis not present

## 2015-05-27 DIAGNOSIS — R300 Dysuria: Secondary | ICD-10-CM | POA: Diagnosis not present

## 2015-05-27 DIAGNOSIS — R319 Hematuria, unspecified: Secondary | ICD-10-CM | POA: Diagnosis not present

## 2015-05-27 DIAGNOSIS — R972 Elevated prostate specific antigen [PSA]: Secondary | ICD-10-CM | POA: Diagnosis not present

## 2015-05-27 DIAGNOSIS — R310 Gross hematuria: Secondary | ICD-10-CM

## 2015-05-27 LAB — COMPLETE METABOLIC PANEL WITHOUT GFR
ALT: 14 U/L (ref 0–53)
AST: 13 U/L (ref 0–37)
BUN: 15 mg/dL (ref 6–23)
Calcium: 8.7 mg/dL (ref 8.4–10.5)
GFR, Est African American: >89 mL/min
GFR, Est Non African American: 78 mL/min
Potassium: 4.6 meq/L (ref 3.5–5.3)
Sodium: 139 meq/L (ref 135–145)
Total Bilirubin: 0.7 mg/dL (ref 0.2–1.2)

## 2015-05-27 LAB — POCT UA - MICROSCOPIC ONLY
Bacteria, U Microscopic: NEGATIVE
Casts, Ur, LPF, POC: NEGATIVE
Crystals, Ur, HPF, POC: NEGATIVE
Epithelial cells, urine per micros: NEGATIVE
Mucus, UA: POSITIVE
Yeast, UA: NEGATIVE

## 2015-05-27 LAB — COMPLETE METABOLIC PANEL WITH GFR
Albumin: 3.8 g/dL (ref 3.5–5.2)
Alkaline Phosphatase: 75 U/L (ref 39–117)
CO2: 28 mEq/L (ref 19–32)
Chloride: 103 mEq/L (ref 96–112)
Creat: 1.02 mg/dL (ref 0.50–1.35)
Glucose, Bld: 94 mg/dL (ref 70–99)
Total Protein: 6.3 g/dL (ref 6.0–8.3)

## 2015-05-27 LAB — POCT URINALYSIS DIPSTICK
Bilirubin, UA: NEGATIVE
Glucose, UA: NEGATIVE
Ketones, UA: NEGATIVE
Leukocytes, UA: NEGATIVE
Nitrite, UA: NEGATIVE
Protein, UA: NEGATIVE
Spec Grav, UA: 1.025
Urobilinogen, UA: 0.2
pH, UA: 6

## 2015-05-27 LAB — POCT CBC
Granulocyte percent: 49.8 %G (ref 37–80)
HCT, POC: 45.1 % (ref 43.5–53.7)
Hemoglobin: 14.9 g/dL (ref 14.1–18.1)
Lymph, poc: 1.5 (ref 0.6–3.4)
MCH, POC: 29.6 pg (ref 27–31.2)
MCHC: 33 g/dL (ref 31.8–35.4)
MCV: 89.7 fL (ref 80–97)
MID (cbc): 0.4 (ref 0–0.9)
MPV: 6.6 fL (ref 0–99.8)
POC Granulocyte: 1.9 — AB (ref 2–6.9)
POC LYMPH PERCENT: 40.7 %L (ref 10–50)
POC MID %: 9.5 %M (ref 0–12)
Platelet Count, POC: 219 10*3/uL (ref 142–424)
RBC: 5.03 M/uL (ref 4.69–6.13)
RDW, POC: 14.1 %
WBC: 3.8 10*3/uL — AB (ref 4.6–10.2)

## 2015-05-27 MED ORDER — PHENAZOPYRIDINE HCL 100 MG PO TABS: 100.0000 mg | ORAL_TABLET | Freq: Two times a day (BID) | ORAL | Status: DC

## 2015-05-27 NOTE — Progress Notes (Signed)
Chief Complaint:  Chief Complaint  Patient presents with  . Headache  . Hematuria    Pt states he has been noticing blood in his urine for several months, he has recently noticed blood in semen  . Back Pain    HPI: Ronald Davenport is a 64 y.o. male who is here for  acute on chronic hematuria, back pain, headache. He has had a full workup for this and has been seen by urology. He has had his PSA monitored. He's also had a biopsy. Urology does not think that this is related to his elevated PSA but he is worried. He has a slight headache which she has not taken anything for. He has occasional headaches. It is located temporally. It is bilateral. He denies any vision changes, nausea, vomiting, abdominal pain. He denies any photophobia or noise sensitivities. He has been eating and drinking normally. He does have some back pain but he is not sure that's related to his work. It is chronic. He has a history of arthritis. Wants to make sure his PSA is normal. He denies any numbness weakness or tingling. He does have some dysuria when he urinates but it's only at the tip of his penis when he urinates. He is sexually active. He still sees some blood in his urine and also in his ejaculate. Again this is not a new occurrence. It periodically occurs.  He has been to urology. According to notes that we recently received from office visit from June 2016 stating that he has BPH with B00 on finasteride , elevated PSA of 8.58 in 2011 status post prostate biopsy which was benign. He also has a history of microhematuria/gross hematuria. He has had a cystoscopy and also abdominal and pelvic CT imaging which was unremarkable except for a incidental adrenal lesion. He has a  Left adrenal lesion that is being followed with serial CT scans. He also has a history of hematospermia.   Past Medical History  Diagnosis Date  . Arthritis   . Adrenal adenoma     left , serial CT scans by urology  . Hematuria     gross  and microscopic, followed by [facility], neg cystoscopy  . BPH (benign prostatic hyperplasia)     with BOO, on finasteride   . BPH with elevated PSA     PSA elevated in 2011, prostate bx in 2011 was negative   . Hematospermia    Past Surgical History  Procedure Laterality Date  . Cataract extraction Left 2006  . Prostate biopsy      negative in 2011   History   Social History  . Marital Status: Married    Spouse Name: N/A  . Number of Children: N/A  . Years of Education: N/A   Occupational History  . disability     DDD lumbar/chronic back pain   Social History Main Topics  . Smoking status: Never Smoker   . Smokeless tobacco: Never Used  . Alcohol Use: 0.0 oz/week    0 Standard drinks or equivalent per week     Comment: occasionally   . Drug Use: No  . Sexual Activity: Yes   Other Topics Concern  . None   Social History Narrative   From [place]; [place] since 1980s.   Family History  Problem Relation Age of Onset  . Colon cancer Brother    No Known Allergies Prior to Admission medications   Medication Sig Start Date End Date Taking? Authorizing  Provider  finasteride (PROSCAR) 5 MG tablet Take 5 mg by mouth daily.   Yes Historical Provider, MD  benzonatate (TESSALON) 100 MG capsule Take 1-2 capsules (100-200 mg total) by mouth 3 (three) times daily as needed. Patient not taking: Reported on 05/27/2015 04/30/15   Posey Boyer, MD     ROS: The patient denies fevers, chills, night sweats, unintentional weight loss, chest pain, palpitations, wheezing, dyspnea on exertion, nausea, vomiting, abdominal pain,   melena, numbness, weakness, or tingling.   All other systems have been reviewed and were otherwise negative with the exception of those mentioned in the HPI and as above.    PHYSICAL EXAM: Filed Vitals:   05/27/15 1329  BP: 120/72  Pulse: 58  Temp: 97.4 F (36.3 C)  Resp: 14   Filed Vitals:   05/27/15 1329  Height: 5\' 7"  (1.702 m)  Weight: 154 lb 6.4  oz (70.035 kg)   Body mass index is 24.18 kg/(m^2).   General: Alert, no acute distress HEENT:  Normocephalic, atraumatic, oropharynx patent. EOMI, PERRLA Cardiovascular:  Regular rate and rhythm, no rubs murmurs or gallops.  No Carotid bruits, radial pulse intact. No pedal edema.  Respiratory: Clear to auscultation bilaterally.  No wheezes, rales, or rhonchi.  No cyanosis, no use of accessory musculature GI: No organomegaly, abdomen is soft and non-tender, positive bowel sounds.  No masses. Skin: No rashes. Neurologic: Facial musculature symmetric. Psychiatric: Patient is appropriate throughout our interaction. Lymphatic: No cervical lymphadenopathy Musculoskeletal: Gait intact. + paramsk tenderness  Full ROM 5/5 strength, 2/2 DTRs No saddle anesthesia Straight leg negative Hip and knee exam--normal    LABS: Results for orders placed or performed in visit on 05/27/15  GC/Chlamydia Probe Amp  Result Value Ref Range   CT Probe RNA NEGATIVE    GC Probe RNA NEGATIVE   COMPLETE METABOLIC PANEL WITH GFR  Result Value Ref Range   Sodium 139 135 - 145 mEq/L   Potassium 4.6 3.5 - 5.3 mEq/L   Chloride 103 96 - 112 mEq/L   CO2 28 19 - 32 mEq/L   Glucose, Bld 94 70 - 99 mg/dL   BUN 15 6 - 23 mg/dL   Creat 1.02 0.50 - 1.35 mg/dL   Total Bilirubin 0.7 0.2 - 1.2 mg/dL   Alkaline Phosphatase 75 39 - 117 U/L   AST 13 0 - 37 U/L   ALT 14 0 - 53 U/L   Total Protein 6.3 6.0 - 8.3 g/dL   Albumin 3.8 3.5 - 5.2 g/dL   Calcium 8.7 8.4 - 10.5 mg/dL   GFR, Est African American >89 mL/min   GFR, Est Non African American 78 mL/min  PSA  Result Value Ref Range   PSA 3.15 <=4.00 ng/mL  POCT CBC  Result Value Ref Range   WBC 3.8 (A) 4.6 - 10.2 K/uL   Lymph, poc 1.5 0.6 - 3.4   POC LYMPH PERCENT 40.7 10 - 50 %L   MID (cbc) 0.4 0 - 0.9   POC MID % 9.5 0 - 12 %M   POC Granulocyte 1.9 (A) 2 - 6.9   Granulocyte percent 49.8 37 - 80 %G   RBC 5.03 4.69 - 6.13 M/uL   Hemoglobin 14.9 14.1 -  18.1 g/dL   HCT, POC 45.1 43.5 - 53.7 %   MCV 89.7 80 - 97 fL   MCH, POC 29.6 27 - 31.2 pg   MCHC 33.0 31.8 - 35.4 g/dL   RDW, POC 14.1 %  Platelet Count, POC 219 142 - 424 K/uL   MPV 6.6 0 - 99.8 fL  POCT UA - Microscopic Only  Result Value Ref Range   WBC, Ur, HPF, POC 0-1    RBC, urine, microscopic 0-3    Bacteria, U Microscopic neg    Mucus, UA pos    Epithelial cells, urine per micros neg    Crystals, Ur, HPF, POC neg    Casts, Ur, LPF, POC neg    Yeast, UA neg   POCT urinalysis dipstick  Result Value Ref Range   Color, UA yellow    Clarity, UA clear    Glucose, UA neg    Bilirubin, UA neg    Ketones, UA neg    Spec Grav, UA 1.025    Blood, UA trace-lysed    pH, UA 6.0    Protein, UA neg    Urobilinogen, UA 0.2    Nitrite, UA neg    Leukocytes, UA Negative Negative     EKG/XRAY:   Primary read interpreted by Dr. Marin Comment at [facility].   ASSESSMENT/PLAN: Encounter Diagnoses  Name Primary?  . Dysuria Yes  . Hematuria   . Elevated PSA   . Hematospermia   . 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 He would like a PSA to be checked. Labs pending: PSA, GC, urine Pyridium Fu prn   Gross sideeffects, risk and benefits, and alternatives of medications d/w patient. Patient is aware that all medications have potential sideeffects and we are unable to predict every sideeffect or drug-drug interaction that may occur.  LE, [place], DO 06/02/2015 7:04 PM

## 2015-05-27 NOTE — Patient Instructions (Signed)
Hematuria °Hematuria is blood in your urine. It can be caused by a bladder infection, kidney infection, prostate infection, kidney stone, or cancer of your urinary tract. Infections can usually be treated with medicine, and a kidney stone usually will pass through your urine. If neither of these is the cause of your hematuria, further workup to find out the reason may be needed. °It is very important that you tell your health care provider about any blood you see in your urine, even if the blood stops without treatment or happens without causing pain. Blood in your urine that happens and then stops and then happens again can be a symptom of a very serious condition. Also, pain is not a symptom in the initial stages of many urinary cancers. °HOME CARE INSTRUCTIONS  °· Drink lots of fluid, 3-4 quarts a day. If you have been diagnosed with an infection, cranberry juice is especially recommended, in addition to large amounts of water. °· Avoid caffeine, tea, and carbonated beverages because they tend to irritate the bladder. °· Avoid alcohol because it may irritate the prostate. °· Take all medicines as directed by your health care provider. °· If you were prescribed an antibiotic medicine, finish it all even if you start to feel better. °· If you have been diagnosed with a kidney stone, follow your health care provider's instructions regarding straining your urine to catch the stone. °· Empty your bladder often. Avoid holding urine for long periods of time. °· After a bowel movement, women should cleanse front to back. Use each tissue only once. °· Empty your bladder before and after sexual intercourse if you are a male. °SEEK MEDICAL CARE IF: °· You develop back pain. °· You have a fever. °· You have a feeling of sickness in your stomach (nausea) or vomiting. °· Your symptoms are not better in 3 days. Return sooner if you are getting worse. °SEEK IMMEDIATE MEDICAL CARE IF:  °· You develop severe vomiting and are  unable to keep the medicine down. °· You develop severe back or abdominal pain despite taking your medicines. °· You begin passing a large amount of blood or clots in your urine. °· You feel extremely weak or faint, or you pass out. °MAKE SURE YOU:  °· Understand these instructions. °· Will watch your condition. °· Will get help right away if you are not doing well or get worse. °Document Released: 11/13/2005 Document Revised: 03/30/2014 Document Reviewed: 07/14/2013 °ExitCare® Patient Information ©2015 [employer]. This information is not intended to replace advice given to you by your health care provider. Make sure you discuss any questions you have with your health care provider. ° °

## 2015-05-28 LAB — PSA: PSA: 3.15 ng/mL (ref <=4.00)

## 2015-06-01 ENCOUNTER — Encounter: Payer: Self-pay | Admitting: Family Medicine

## 2015-06-01 LAB — GC/CHLAMYDIA PROBE AMP
CT Probe RNA: NEGATIVE
GC Probe RNA: NEGATIVE

## 2015-06-02 ENCOUNTER — Encounter: Payer: Self-pay | Admitting: Family Medicine

## 2015-06-03 ENCOUNTER — Other Ambulatory Visit: Payer: Self-pay | Admitting: Urology

## 2015-06-03 DIAGNOSIS — R972 Elevated prostate specific antigen [PSA]: Secondary | ICD-10-CM

## 2015-06-17 ENCOUNTER — Ambulatory Visit (HOSPITAL_COMMUNITY): Payer: Self-pay

## 2015-06-22 ENCOUNTER — Other Ambulatory Visit (HOSPITAL_COMMUNITY): Payer: Self-pay

## 2015-07-13 ENCOUNTER — Other Ambulatory Visit: Payer: Self-pay | Admitting: Urology

## 2015-07-13 DIAGNOSIS — D3502 Benign neoplasm of left adrenal gland: Secondary | ICD-10-CM

## 2015-07-20 ENCOUNTER — Ambulatory Visit (HOSPITAL_COMMUNITY)
Admission: RE | Admit: 2015-07-20 | Discharge: 2015-07-20 | Disposition: A | Discharge status: Home / Self Care | Payer: Medicare Other | Source: Ambulatory Visit | Attending: Urology | Admitting: Urology

## 2015-07-20 DIAGNOSIS — D3502 Benign neoplasm of left adrenal gland: Secondary | ICD-10-CM

## 2015-07-20 DIAGNOSIS — D1800 Hemangioma unspecified site: Secondary | ICD-10-CM | POA: Diagnosis not present

## 2015-07-20 LAB — POCT I-STAT CREATININE: CREATININE: 1.5 mg/dL — AB (ref 0.61–1.24)

## 2015-07-20 MED ORDER — GADOBENATE DIMEGLUMINE 529 MG/ML IV SOLN
15.0000 mL | Freq: Once | INTRAVENOUS | Status: AC | PRN
  Administered 2015-07-20: 15 mL via INTRAVENOUS

## 2015-07-26 ENCOUNTER — Other Ambulatory Visit (HOSPITAL_COMMUNITY): Payer: Self-pay

## 2015-09-24 ENCOUNTER — Ambulatory Visit (INDEPENDENT_AMBULATORY_CARE_PROVIDER_SITE_OTHER): Payer: Medicare Other | Admitting: Emergency Medicine

## 2015-09-24 VITALS — BP 106/60 | HR 67 | Temp 97.3°F | Resp 16 | Ht 67.0 in | Wt 153.5 lb

## 2015-09-24 DIAGNOSIS — R6883 Chills (without fever): Secondary | ICD-10-CM | POA: Diagnosis not present

## 2015-09-24 DIAGNOSIS — G4489 Other headache syndrome: Secondary | ICD-10-CM

## 2015-09-24 LAB — POCT CBC
GRANULOCYTE PERCENT: 55 % (ref 37–80)
HEMATOCRIT: 44.3 % (ref 43.5–53.7)
Hemoglobin: 14.8 g/dL (ref 14.1–18.1)
Lymph, poc: 1.5 (ref 0.6–3.4)
MCH, POC: 30.5 pg (ref 27–31.2)
MCHC: 33.4 g/dL (ref 31.8–35.4)
MCV: 91.5 fL (ref 80–97)
MID (CBC): 0.4 (ref 0–0.9)
MPV: 7.1 fL (ref 0–99.8)
POC GRANULOCYTE: 2.4 (ref 2–6.9)
POC LYMPH %: 35.8 % (ref 10–50)
POC MID %: 9.2 % (ref 0–12)
Platelet Count, POC: 177 10*3/uL (ref 142–424)
RBC: 4.84 M/uL (ref 4.69–6.13)
RDW, POC: 13.7 %
WBC: 4.3 10*3/uL — AB (ref 4.6–10.2)

## 2015-09-24 LAB — POC MICROSCOPIC URINALYSIS (UMFC)

## 2015-09-24 LAB — POCT URINALYSIS DIP (MANUAL ENTRY)
BILIRUBIN UA: NEGATIVE
BILIRUBIN UA: NEGATIVE
GLUCOSE UA: NEGATIVE
LEUKOCYTES UA: NEGATIVE
Nitrite, UA: NEGATIVE
Protein Ur, POC: NEGATIVE
SPEC GRAV UA: 1.02
Urobilinogen, UA: 0.2
pH, UA: 7

## 2015-09-24 NOTE — Progress Notes (Signed)
Subjective:  Patient ID: Ronald Davenport, male    DOB: 1951-05-17  Age: 64 y.o. MRN: [id number]  CC: Headache   HPI Ronald Davenport presents  he complains of a 2-3 months duration intermittent headache, probably on the right side of his head. He has no history of injury. No antecedent illness. He has no nasal congestion postnasal drainage. He has no cough or sore throat. He has no nausea vomiting or stool change. He has no difficulty gait balance or coordination. No speech disorder. He has no History of unconsciousness. No numbness tingling or weakness in his arms and legs.  He is currently under a rather slow evaluation for what could be a pheochromocytoma. He said that he has an abnormal chest x-ray as well but there is no evidence that on the medical record that we have available. He is under treatment for the mass on his adrenal gland by the folks at [facility].  He thought due to his malaise and myalgias that he should take a pill from [place] that is for treatment of malaria. He's been in the [place] in the mid 80s and has never been infected with malaria the best of his knowledge. He can't explain why thinks he should be treated for malaria  He denies any other complaints and no improvement with any medication.  History Ronald Davenport has a past medical history of Arthritis; Adrenal adenoma; Hematuria; BPH (benign prostatic hyperplasia); BPH with elevated PSA; and Hematospermia.   He has past surgical history that includes Cataract extraction (Left, 2006) and Prostate biopsy.   His  family history includes Colon cancer in his brother.  He   reports that he has never smoked. He has never used smokeless tobacco. He reports that he drinks alcohol. He reports that he does not use illicit drugs.  Outpatient Prescriptions Prior to Visit  Medication Sig Dispense Refill  . finasteride (PROSCAR) 5 MG tablet Take 5 mg by mouth daily.    . benzonatate (TESSALON) 100 MG capsule Take 1-2  capsules (100-200 mg total) by mouth 3 (three) times daily as needed. (Patient not taking: Reported on 05/27/2015) 30 capsule 0  . phenazopyridine (PYRIDIUM) 100 MG tablet Take 1 tablet (100 mg total) by mouth 2 (two) times daily. (Patient not taking: Reported on 09/24/2015) 6 tablet 0   No facility-administered medications prior to visit.    Social History   Social History  . Marital Status: Married    Spouse Name: N/A  . Number of Children: N/A  . Years of Education: N/A   Occupational History  . disability     DDD lumbar/chronic back pain   Social History Main Topics  . Smoking status: Never Smoker   . Smokeless tobacco: Never Used  . Alcohol Use: 0.0 oz/week    0 Standard drinks or equivalent per week     Comment: occasionally   . Drug Use: No  . Sexual Activity: Yes   Other Topics Concern  . None   Social History Narrative   From [place]; [place] since 1980s.     Review of Systems  Constitutional: Negative for fever, chills and appetite change.  HENT: Negative for congestion, ear pain, postnasal drip, sinus pressure and sore throat.   Eyes: Negative for pain and redness.  Respiratory: Negative for cough, shortness of breath and wheezing.   Cardiovascular: Negative for leg swelling.  Gastrointestinal: Negative for nausea, vomiting, abdominal pain, diarrhea, constipation and blood in stool.  Endocrine: Negative for polyuria.  Genitourinary: Negative for dysuria, urgency, frequency and flank pain.  Musculoskeletal: Negative for gait problem.  Skin: Negative for rash.  Neurological: Positive for headaches. Negative for weakness.  Psychiatric/Behavioral: Negative for confusion and decreased concentration. The patient is not nervous/anxious.     Objective:  BP 106/60 mmHg  Pulse 67  Temp(Src) 97.3 F (36.3 C) (Oral)  Resp 16  Ht 5\' 7"  (1.702 m)  Wt 153 lb 8 oz (69.627 kg)  BMI 24.04 kg/m2  SpO2 98%  Physical Exam  Constitutional: He is oriented to person, place,  and time. He appears well-developed and well-nourished. No distress.  HENT:  Head: Normocephalic and atraumatic.  Right Ear: External ear normal.  Left Ear: External ear normal.  Nose: Nose normal.  Eyes: Conjunctivae and EOM are normal. Pupils are equal, round, and reactive to light. No scleral icterus.  Neck: Normal range of motion. Neck supple. No tracheal deviation present.  Cardiovascular: Normal rate, regular rhythm and normal heart sounds.   Pulmonary/Chest: Effort normal. No respiratory distress. He has no wheezes. He has no rales.  Abdominal: He exhibits no mass. There is no tenderness. There is no rebound and no guarding.  Musculoskeletal: He exhibits no edema.  Lymphadenopathy:    He has no cervical adenopathy.  Neurological: He is alert and oriented to person, place, and time. Coordination normal.  Skin: Skin is warm and dry. No rash noted.  Psychiatric: He has a normal mood and affect. His behavior is normal.      Assessment & Plan:   Ronald Davenport was seen today for headache.  Diagnoses and all orders for this visit:  Other headache syndrome -     POCT CBC -     Comprehensive metabolic panel -     TSH -     POCT urinalysis dipstick -     POCT Microscopic Urinalysis ([facility]) -     MR Brain W Wo Contrast; Future  Chills -     POCT CBC -     Comprehensive metabolic panel -     TSH -     POCT urinalysis dipstick -     POCT Microscopic Urinalysis ([facility])   I am having Ronald Davenport maintain his finasteride, benzonatate, and phenazopyridine.  No orders of the defined types were placed in this encounter.    Appropriate red flag conditions were discussed with the patient as well as actions that should be taken.  Patient expressed his understanding.  Follow-up: No Follow-up on file.  Roselee Culver, MD   Results for orders placed or performed in visit on 09/24/15  POCT CBC  Result Value Ref Range   WBC 4.3 (A) 4.6 - 10.2 K/uL   Lymph, poc 1.5 0.6 - 3.4   POC  LYMPH PERCENT 35.8 10 - 50 %L   MID (cbc) 0.4 0 - 0.9   POC MID % 9.2 0 - 12 %M   POC Granulocyte 2.4 2 - 6.9   Granulocyte percent 55.0 37 - 80 %G   RBC 4.84 4.69 - 6.13 M/uL   Hemoglobin 14.8 14.1 - 18.1 g/dL   HCT, POC 44.3 43.5 - 53.7 %   MCV 91.5 80 - 97 fL   MCH, POC 30.5 27 - 31.2 pg   MCHC 33.4 31.8 - 35.4 g/dL   RDW, POC 13.7 %   Platelet Count, POC 177 142 - 424 K/uL   MPV 7.1 0 - 99.8 fL  POCT urinalysis dipstick  Result Value Ref Range  Color, UA yellow yellow   Clarity, UA clear clear   Glucose, UA negative negative   Bilirubin, UA negative negative   Ketones, POC UA negative negative   Spec Grav, UA 1.020    Blood, UA trace-intact (A) negative   pH, UA 7.0    Protein Ur, POC negative negative   Urobilinogen, UA 0.2    Nitrite, UA Negative Negative   Leukocytes, UA Negative Negative  POCT Microscopic Urinalysis ([facility])  Result Value Ref Range   WBC,UR,HPF,POC None None WBC/hpf   RBC,UR,HPF,POC Few (A) None RBC/hpf   Bacteria None None, Too numerous to count   Mucus Present (A) Absent   Epithelial Cells, UR Per Microscopy Few (A) None, Too numerous to count cells/hpf

## 2015-09-24 NOTE — Patient Instructions (Signed)

## 2015-09-25 LAB — COMPREHENSIVE METABOLIC PANEL
ALBUMIN: 4.2 g/dL (ref 3.6–5.1)
ALT: 13 U/L (ref 9–46)
AST: 15 U/L (ref 10–35)
Alkaline Phosphatase: 74 U/L (ref 40–115)
BILIRUBIN TOTAL: 1.1 mg/dL (ref 0.2–1.2)
BUN: 14 mg/dL (ref 7–25)
CALCIUM: 9.3 mg/dL (ref 8.6–10.3)
CHLORIDE: 101 mmol/L (ref 98–110)
CO2: 29 mmol/L (ref 20–31)
Creat: 1.03 mg/dL (ref 0.70–1.25)
GLUCOSE: 94 mg/dL (ref 65–99)
POTASSIUM: 4.4 mmol/L (ref 3.5–5.3)
Sodium: 138 mmol/L (ref 135–146)
Total Protein: 6.8 g/dL (ref 6.1–8.1)

## 2015-09-25 LAB — TSH: TSH: 1.65 u[IU]/mL (ref 0.350–4.500)

## 2015-10-11 ENCOUNTER — Telehealth: Payer: Self-pay

## 2015-10-11 NOTE — Telephone Encounter (Signed)
Referral was authorized on 09/27/15.  Where do we stand with referral?

## 2015-10-11 NOTE — Telephone Encounter (Signed)
Patient came to walk in center to get more information about a referral for an MRI. He was last seen on 09/24/2015 headaches.  [phone]

## 2015-10-12 NOTE — Addendum Note (Signed)
Addended by: Roselee Culver on: 10/12/2015 08:31 AM   Modules accepted: Orders

## 2015-10-14 NOTE — Telephone Encounter (Signed)
[facility] has reached out to schedule, but he does not answer his phone and his mailbox is full.

## 2015-11-01 ENCOUNTER — Ambulatory Visit (INDEPENDENT_AMBULATORY_CARE_PROVIDER_SITE_OTHER): Payer: Medicare Other | Admitting: Neurology

## 2015-11-01 ENCOUNTER — Encounter (INDEPENDENT_AMBULATORY_CARE_PROVIDER_SITE_OTHER): Payer: Self-pay

## 2015-11-01 ENCOUNTER — Encounter: Payer: Self-pay | Admitting: Neurology

## 2015-11-01 VITALS — BP 134/81 | HR 60 | Ht 67.0 in | Wt 155.0 lb

## 2015-11-01 DIAGNOSIS — R51 Headache: Secondary | ICD-10-CM

## 2015-11-01 DIAGNOSIS — G44209 Tension-type headache, unspecified, not intractable: Secondary | ICD-10-CM

## 2015-11-01 DIAGNOSIS — R972 Elevated prostate specific antigen [PSA]: Secondary | ICD-10-CM

## 2015-11-01 DIAGNOSIS — R519 Headache, unspecified: Secondary | ICD-10-CM | POA: Insufficient documentation

## 2015-11-01 NOTE — Progress Notes (Signed)
PATIENT: Ronald Davenport DOB: 08-31-1951  Chief Complaint  Patient presents with  . Headache    He has been getting 3-4 headaches weekly for the last four months. He sparingly takes Advil to treat his more severe pain.  He has occasional blurred vision with his headaches.     HISTORICAL  [place] is a 64 years old right-handed male, seen in refer by his primary care physician Dr. Laney Pastor in December fifth 2016 for evaluation of severe headaches  He is a native of [place], had a history of elevated PSA, biopsy was negative, on disability due to low back pain  He denied previous history of headaches, since August 2016, correlate with this finding of abnormal adrenal gland mass, he began to have headaches, usually vortex region, pressure sometimes severe pounding headache lasting for a few hours to whole day, there was no light noise sensitivity, no nausea, he has tried over-the-counter Advil without significant improvement  I have reviewed MRI adrenal: Left adrenal lesion which demonstrates T2 hyperintensity and primarily late post-contrast enhancement. This is suspicious for a pheochromocytoma. Differential considerations include metastasis from unknown primary or less likely lipid poor adenoma.2. Caudate lobe hemangioma.  He later had further evaluation by specialist, reported mild abnormal scan of his lung, there was small lung nodule, will continue observation, repeat test in 3 months.  His headaches over all has improved, he has mild blurry vision, no dysarthria, no lateralized motor or sensory deficit    REVIEW OF SYSTEMS: Full 14 system review of systems performed and notable only for constipation, numbness  ALLERGIES: No Known Allergies  HOME MEDICATIONS: Current Outpatient Prescriptions  Medication Sig Dispense Refill  . finasteride (PROSCAR) 5 MG tablet Take 5 mg by mouth daily.     No current facility-administered medications for this visit.    PAST MEDICAL  HISTORY: Past Medical History  Diagnosis Date  . Arthritis   . Adrenal adenoma     left , serial CT scans by urology  . Hematuria     gross and microscopic, followed by [facility], neg cystoscopy  . BPH (benign prostatic hyperplasia)     with BOO, on finasteride   . BPH with elevated PSA     PSA elevated in 2011, prostate bx in 2011 was negative   . Hematospermia   . Headache     PAST SURGICAL HISTORY: Past Surgical History  Procedure Laterality Date  . Cataract extraction Left 2006  . Prostate biopsy      negative in 2011  . Shoulder surgery Right 2006    FAMILY HISTORY: Family History  Problem Relation Age of Onset  . Colon cancer Brother   . Stroke Mother     SOCIAL HISTORY:  Social History   Social History  . Marital Status: Married    Spouse Name: N/A  . Number of Children: 3  . Years of Education: College    Occupational History  . disability     DDD lumbar/chronic back pain   Social History Main Topics  . Smoking status: Never Smoker   . Smokeless tobacco: Never Used  . Alcohol Use: 0.0 oz/week    0 Standard drinks or equivalent per week     Comment: occasionally - maybe 1-2 times per year.  . Drug Use: No  . Sexual Activity: Yes   Other Topics Concern  . Not on file   Social History Narrative   From [place]; [place] since 1980s.   Lives at  home with family.   Right-handed.   No caffeine use.     PHYSICAL EXAM   Filed Vitals:   11/01/15 1011  BP: 134/81  Pulse: 60  Height: _0  (1.702 m)  Weight: 155 lb (70.308 kg)    Not recorded      Body mass index is 24.27 kg/(m^2).  PHYSICAL EXAMNIATION:  Gen: NAD, conversant, well nourised, obese, well groomed                     Cardiovascular: Regular rate rhythm, no peripheral edema, warm, nontender. Eyes: Conjunctivae clear without exudates or hemorrhage Neck: Supple, no carotid bruise. Pulmonary: Clear to auscultation bilaterally   NEUROLOGICAL EXAM:  MENTAL  STATUS: Speech:    Speech is normal; fluent and spontaneous with normal comprehension.  Cognition:     Orientation to time, place and person     Normal recent and remote memory     Normal Attention span and concentration     Normal Language, naming, repeating,spontaneous speech     Fund of knowledge   CRANIAL NERVES: CN II: Visual fields are full to confrontation. Fundoscopic exam is normal with sharp discs and no vascular changes. Pupils are round equal and briskly reactive to light. CN III, IV, VI: extraocular movement are normal. No ptosis. CN V: Facial sensation is intact to pinprick in all 3 divisions bilaterally. Corneal responses are intact.  CN VII: Face is symmetric with normal eye closure and smile. CN VIII: Hearing is normal to rubbing fingers CN IX, X: Palate elevates symmetrically. Phonation is normal. CN XI: Head turning and shoulder shrug are intact CN XII: Tongue is midline with normal movements and no atrophy.  MOTOR: There is no pronator drift of out-stretched arms. Muscle bulk and tone are normal. Muscle strength is normal.  REFLEXES: Reflexes are 2+ and symmetric at the biceps, triceps, knees, and ankles. Plantar responses are flexor.  SENSORY: Intact to light touch, pinprick, position sense, and vibration sense are intact in fingers and toes.  COORDINATION: Rapid alternating movements and fine finger movements are intact. There is no dysmetria on finger-to-nose and heel-knee-shin.    GAIT/STANCE: Posture is normal. Gait is steady with normal steps, base, arm swing, and turning. Heel and toe walking are normal. Tandem gait is normal.  Romberg is absent.   DIAGNOSTIC DATA (LABS, IMAGING, TESTING) - I reviewed patient records, labs, notes, testing and imaging myself where available.   ASSESSMENT AND PLAN  [place] is a 64 y.o. male   New-onset tension headaches Abnormal adrenal mass, elevated PSA   ESR C-reactive protein to rule out temporal  arteritis  MRI of the brain with and without contrast to rule out structural lesion, I will call him report,  If there was no significant abnormality, may follow-up with nurse practitioner Megan in 2 weeks, if there are any abnormalities, I will see him in my clinic    Marcial Pacas, M.D. Ph.D.  [facility] [address] [place], [place] [address] Ph: [phone] Fax: [phone]  CC: Leandrew Koyanagi, MD

## 2015-11-02 ENCOUNTER — Telehealth: Payer: Self-pay | Admitting: *Deleted

## 2015-11-02 LAB — C-REACTIVE PROTEIN: CRP: 0.5 mg/L (ref 0.0–4.9)

## 2015-11-02 LAB — SEDIMENTATION RATE: Sed Rate: 2 mm/hr (ref 0–30)

## 2015-11-02 NOTE — Telephone Encounter (Signed)
I called pt with lab results.   (normal).  He verbalized understanding.   Has MRI appt , and then RV with [place] 11-15-15 with MM/NP.

## 2015-11-12 ENCOUNTER — Ambulatory Visit
Admission: RE | Admit: 2015-11-12 | Discharge: 2015-11-12 | Disposition: A | Discharge status: Home / Self Care | Payer: Medicare Other | Source: Ambulatory Visit | Attending: Neurology | Admitting: Neurology

## 2015-11-12 DIAGNOSIS — R972 Elevated prostate specific antigen [PSA]: Secondary | ICD-10-CM | POA: Diagnosis not present

## 2015-11-12 DIAGNOSIS — G44209 Tension-type headache, unspecified, not intractable: Secondary | ICD-10-CM | POA: Diagnosis not present

## 2015-11-12 DIAGNOSIS — R51 Headache: Secondary | ICD-10-CM | POA: Diagnosis not present

## 2015-11-12 MED ORDER — GADOBENATE DIMEGLUMINE 529 MG/ML IV SOLN
14.0000 mL | Freq: Once | INTRAVENOUS | Status: AC | PRN
  Administered 2015-11-12: 14 mL via INTRAVENOUS

## 2015-11-15 ENCOUNTER — Encounter: Payer: Self-pay | Admitting: Adult Health

## 2015-11-15 ENCOUNTER — Ambulatory Visit (INDEPENDENT_AMBULATORY_CARE_PROVIDER_SITE_OTHER): Payer: Medicare Other | Admitting: Adult Health

## 2015-11-15 VITALS — BP 131/83 | HR 70 | Ht 67.0 in | Wt 158.5 lb

## 2015-11-15 DIAGNOSIS — R51 Headache: Secondary | ICD-10-CM | POA: Diagnosis not present

## 2015-11-15 DIAGNOSIS — R519 Headache, unspecified: Secondary | ICD-10-CM

## 2015-11-15 NOTE — Progress Notes (Signed)
PATIENT: [place] DOB: 1951-04-26  REASON FOR VISIT: follow up-  Headache HISTORY FROM: patient  HISTORY OF PRESENT ILLNESS: [name]  Is a 64 year old male with a history of tension-type headaches. He returns today for follow-up. At the last visit the patient had blood work and MRI. The patient's blood work was unremarkable. The patient's MRI was also unremarkable although it did show  severe chronic maxillary, ethmoid and frontal sinusitis.  The patient reports that in the last week his headaches have improved drastically. He states in the past when he got a  Headache it was normally located in the top of the head. He describes his headache as a throbbing pain. He denies photophobia or phonophobia , nausea or vomiting. The patient states that he has had a cold in the last week. He states that he's had a runny nose watery eyes. He states however this is beginning to improve.  He denies any new neurological symptoms. He does report that he's been having right shoulder pain for approximately 2 weeks. He has not followed up with his primary care regards to this. He returns today for an evaluation.  HISTORY 11/01/15 [facility] [place] is a 64 years old right-handed male, seen in refer by his primary care physician Dr. Laney Pastor in December fifth 2016 for evaluation of severe headaches  He is a native of [name], had a history of elevated PSA, biopsy was negative, on disability due to low back pain  He denied previous history of headaches, since August 2016, correlate with this finding of abnormal adrenal gland mass, he began to have headaches, usually vortex region, pressure sometimes severe pounding headache lasting for a few hours to whole day, there was no light noise sensitivity, no nausea, he has tried over-the-counter Advil without significant improvement  I have reviewed MRI adrenal: Left adrenal lesion which demonstrates T2 hyperintensity and primarily late post-contrast  enhancement. This is suspicious for a pheochromocytoma. Differential considerations include metastasis from unknown primary or less likely lipid poor adenoma.2. Caudate lobe hemangioma.  He later had further evaluation by specialist, reported mild abnormal scan of his lung, there was small lung nodule, will continue observation, repeat test in 3 months.  His headaches over all has improved, he has mild blurry vision, no dysarthria, no lateralized motor or sensory deficit  REVIEW OF SYSTEMS: Out of a complete 14 system review of symptoms, the patient complains only of the following symptoms, and all other reviewed systems are negative.   back pain, muscle cramps, numbness  ALLERGIES: No Known Allergies  HOME MEDICATIONS: Outpatient Prescriptions Prior to Visit  Medication Sig Dispense Refill  . finasteride (PROSCAR) 5 MG tablet Take 5 mg by mouth daily.     No facility-administered medications prior to visit.    PAST MEDICAL HISTORY: Past Medical History  Diagnosis Date  . Arthritis   . Adrenal adenoma     left , serial CT scans by urology  . Hematuria     gross and microscopic, followed by [facility], neg cystoscopy  . BPH (benign prostatic hyperplasia)     with BOO, on finasteride   . BPH with elevated PSA     PSA elevated in 2011, prostate bx in 2011 was negative   . Hematospermia   . Headache     PAST SURGICAL HISTORY: Past Surgical History  Procedure Laterality Date  . Cataract extraction Left 2006  . Prostate biopsy      negative in 2011  .  Shoulder surgery Right 2006    FAMILY HISTORY: Family History  Problem Relation Age of Onset  . Colon cancer Brother   . Stroke Mother     SOCIAL HISTORY: Social History   Social History  . Marital Status: Married    Spouse Name: N/A  . Number of Children: 3  . Years of Education: College    Occupational History  . disability     DDD lumbar/chronic back pain   Social History Main Topics  . Smoking  status: Never Smoker   . Smokeless tobacco: Never Used  . Alcohol Use: 0.0 oz/week    0 Standard drinks or equivalent per week     Comment: occasionally - maybe 1-2 times per year.  . Drug Use: No  . Sexual Activity: Yes   Other Topics Concern  . Not on file   Social History Narrative   From [place]; [place] since 1980s.   Lives at home with family.   Right-handed.   No caffeine use.      PHYSICAL EXAM  Filed Vitals:   11/15/15 1036  BP: 131/83  Pulse: 70  Height: 5\' 7"  (1.702 m)  Weight: 158 lb 8 oz (71.895 kg)   Body mass index is 24.82 kg/(m^2).  Generalized: Well developed, in no acute distress   Neurological examination  Mentation: Alert oriented to time, place, history taking. Follows all commands speech and language fluent Cranial nerve II-XII: Pupils were equal round reactive to light. Extraocular movements were full, visual field were full on confrontational test. Facial sensation and strength were normal. Uvula tongue midline. Head turning and shoulder shrug  were normal and symmetric. Motor: The motor testing reveals 5 over 5 strength of all 4 extremities. Good symmetric motor tone is noted throughout.  Range of motion good in the right shoulder except some discomfort with lifting the arm been no limitations. Sensory: Sensory testing is intact to soft touch on all 4 extremities. No evidence of extinction is noted.  Coordination: Cerebellar testing reveals good finger-nose-finger and heel-to-shin bilaterally.  Gait and station: Gait is normal. Tandem gait is normal. Romberg is negative. No drift is seen.  Reflexes: Deep tendon reflexes are symmetric and normal bilaterally.   DIAGNOSTIC DATA (LABS, IMAGING, TESTING) - I reviewed patient records, labs, notes, testing and imaging myself where available.   MRI brain  With and without contrast 12/16 /2016:  This MRI of the brain with and without contrast shows the following: 1. The brain appears normal before and  after contrast administration. 2. There is severe bilateral chronic maxillary, ethmoid and frontal sinusitis. The sphenoid sinus appears normal.  Lab Results  Component Value Date   WBC 4.3* 09/24/2015   HGB 14.8 09/24/2015   HCT 44.3 09/24/2015   MCV 91.5 09/24/2015   PLT 219 09/17/2014      Component Value Date/Time   NA 138 09/24/2015 1802   K 4.4 09/24/2015 1802   CL 101 09/24/2015 1802   CO2 29 09/24/2015 1802   GLUCOSE 94 09/24/2015 1802   BUN 14 09/24/2015 1802   CREATININE 1.03 09/24/2015 1802   CREATININE 1.50* 07/20/2015 0809   CALCIUM 9.3 09/24/2015 1802   PROT 6.8 09/24/2015 1802   ALBUMIN 4.2 09/24/2015 1802   AST 15 09/24/2015 1802   ALT 13 09/24/2015 1802   ALKPHOS 74 09/24/2015 1802   BILITOT 1.1 09/24/2015 1802   GFRNONAA 78 05/27/2015 1412   GFRAA >89 05/27/2015 1412    Lab Results  Component Value Date  TSH 1.650 09/24/2015      ASSESSMENT AND PLAN 64 y.o. year old male  has a past medical history of Arthritis; Adrenal adenoma; Hematuria; BPH (benign prostatic hyperplasia); BPH with elevated PSA; Hematospermia; and Headache. here with:   1. Headache   The patient's headaches have actually improved in the last week. For now we will continue to monitor this. The patient's MRI was essentially normal. Patient advised that if his headache frequency increases he should let us know. He should follow-up with his primary care in regards to his shoulder pain. Patient will follow-up in 3-4 months or sooner if needed.   Ward Givens, MSN, NP-C 11/15/2015, 11:02 AM [facility] [address] [place], [place] [address] [phone]

## 2015-11-15 NOTE — Patient Instructions (Signed)
If headache frequency increases please let us know.  Follow-up with PCP regarding shoulder pain If your symptoms worsen or you develop new symptoms please let us know.

## 2015-11-15 NOTE — Progress Notes (Signed)
I agree with the assessment and plan as directed by NP .The patient is known to me .   Waniya Hoglund, MD  

## 2015-11-16 ENCOUNTER — Ambulatory Visit (INDEPENDENT_AMBULATORY_CARE_PROVIDER_SITE_OTHER): Payer: Medicare Other

## 2015-11-16 ENCOUNTER — Ambulatory Visit (INDEPENDENT_AMBULATORY_CARE_PROVIDER_SITE_OTHER): Payer: Medicare Other | Admitting: Internal Medicine

## 2015-11-16 VITALS — BP 130/70 | HR 63 | Temp 97.4°F | Resp 18 | Ht 67.0 in | Wt 157.6 lb

## 2015-11-16 DIAGNOSIS — K047 Periapical abscess without sinus: Secondary | ICD-10-CM

## 2015-11-16 DIAGNOSIS — M25512 Pain in left shoulder: Secondary | ICD-10-CM | POA: Diagnosis not present

## 2015-11-16 DIAGNOSIS — R519 Headache, unspecified: Secondary | ICD-10-CM

## 2015-11-16 DIAGNOSIS — J320 Chronic maxillary sinusitis: Secondary | ICD-10-CM

## 2015-11-16 DIAGNOSIS — D3502 Benign neoplasm of left adrenal gland: Secondary | ICD-10-CM | POA: Diagnosis not present

## 2015-11-16 DIAGNOSIS — S40012A Contusion of left shoulder, initial encounter: Secondary | ICD-10-CM | POA: Diagnosis not present

## 2015-11-16 DIAGNOSIS — S40022A Contusion of left upper arm, initial encounter: Secondary | ICD-10-CM

## 2015-11-16 DIAGNOSIS — R51 Headache: Secondary | ICD-10-CM

## 2015-11-16 DIAGNOSIS — D3500 Benign neoplasm of unspecified adrenal gland: Secondary | ICD-10-CM | POA: Insufficient documentation

## 2015-11-16 MED ORDER — AMOXICILLIN 875 MG PO TABS: 875.0000 mg | ORAL_TABLET | Freq: Two times a day (BID) | ORAL | Status: DC

## 2015-11-16 MED ORDER — FLUTICASONE PROPIONATE 50 MCG/ACT NA SUSP: NASAL | Status: DC

## 2015-11-16 MED ORDER — HYDROCODONE-ACETAMINOPHEN 5-325 MG PO TABS: 1.0000 | ORAL_TABLET | Freq: Four times a day (QID) | ORAL | Status: DC | PRN

## 2015-11-16 MED ORDER — MELOXICAM 15 MG PO TABS: 15.0000 mg | ORAL_TABLET | Freq: Every day | ORAL | Status: DC
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
## 2015-11-17 ENCOUNTER — Other Ambulatory Visit: Payer: Self-pay | Admitting: Neurology

## 2015-11-17 DIAGNOSIS — G44209 Tension-type headache, unspecified, not intractable: Secondary | ICD-10-CM

## 2015-11-17 DIAGNOSIS — R972 Elevated prostate specific antigen [PSA]: Secondary | ICD-10-CM

## 2015-11-23 ENCOUNTER — Telehealth: Payer: Self-pay | Admitting: Neurology

## 2015-11-23 DIAGNOSIS — J329 Chronic sinusitis, unspecified: Secondary | ICD-10-CM

## 2015-11-23 NOTE — Telephone Encounter (Signed)
Left voicemail asking patient to call back for MRI results.

## 2015-11-23 NOTE — Telephone Encounter (Signed)
Please call patient, MRI of the brain showed mild supratentorium small vessel disease, no acute findings, but there was evidence of severe bilateral chronic sinusitis, he would benefit ENT evaluation, if he wants, we can put refer in    IMPRESSION: This MRI of the brain with and without contrast shows the following: 1. The brain appears normal before and after contrast administration. 2. There is severe bilateral chronic maxillary, ethmoid and frontal sinusitis. The sphenoid sinus appears normal.

## 2015-11-23 NOTE — Telephone Encounter (Signed)
Patient called back. I went over MRI results with him, per Dr. Rhea Belton note. He is interested in seeing ENT doctor. Referral has been entered.

## 2015-11-23 NOTE — Addendum Note (Signed)
Addended by: Georgiann Mccoy L on: 11/23/2015 04:51 PM   Modules accepted: Orders

## 2015-12-02 ENCOUNTER — Other Ambulatory Visit: Payer: Medicare Other

## 2015-12-11 ENCOUNTER — Encounter (HOSPITAL_COMMUNITY): Payer: Self-pay | Admitting: Emergency Medicine

## 2015-12-11 ENCOUNTER — Encounter: Payer: Self-pay | Admitting: Family Medicine

## 2015-12-11 ENCOUNTER — Emergency Department (HOSPITAL_COMMUNITY)
Admission: EM | Admit: 2015-12-11 | Discharge: 2015-12-11 | Disposition: A | Discharge status: Home / Self Care | Payer: Medicare Other | Attending: Emergency Medicine | Admitting: Emergency Medicine

## 2015-12-11 ENCOUNTER — Ambulatory Visit (INDEPENDENT_AMBULATORY_CARE_PROVIDER_SITE_OTHER): Payer: Medicare Other | Admitting: Family Medicine

## 2015-12-11 VITALS — BP 130/80 | HR 77 | Temp 98.2°F | Resp 20 | Ht 67.0 in | Wt 158.2 lb

## 2015-12-11 DIAGNOSIS — Z7951 Long term (current) use of inhaled steroids: Secondary | ICD-10-CM | POA: Insufficient documentation

## 2015-12-11 DIAGNOSIS — M1990 Unspecified osteoarthritis, unspecified site: Secondary | ICD-10-CM | POA: Insufficient documentation

## 2015-12-11 DIAGNOSIS — Z85858 Personal history of malignant neoplasm of other endocrine glands: Secondary | ICD-10-CM | POA: Diagnosis not present

## 2015-12-11 DIAGNOSIS — R030 Elevated blood-pressure reading, without diagnosis of hypertension: Secondary | ICD-10-CM | POA: Insufficient documentation

## 2015-12-11 DIAGNOSIS — Z87448 Personal history of other diseases of urinary system: Secondary | ICD-10-CM | POA: Insufficient documentation

## 2015-12-11 DIAGNOSIS — Z79899 Other long term (current) drug therapy: Secondary | ICD-10-CM | POA: Insufficient documentation

## 2015-12-11 DIAGNOSIS — R51 Headache: Secondary | ICD-10-CM | POA: Insufficient documentation

## 2015-12-11 DIAGNOSIS — R519 Headache, unspecified: Secondary | ICD-10-CM

## 2015-12-11 DIAGNOSIS — J0100 Acute maxillary sinusitis, unspecified: Secondary | ICD-10-CM

## 2015-12-11 DIAGNOSIS — IMO0001 Reserved for inherently not codable concepts without codable children: Secondary | ICD-10-CM

## 2015-12-11 LAB — I-STAT CHEM 8, ED
BUN: 20 mg/dL (ref 6–20)
CALCIUM ION: 1.2 mmol/L (ref 1.13–1.30)
CHLORIDE: 102 mmol/L (ref 101–111)
Creatinine, Ser: 1 mg/dL (ref 0.61–1.24)
GLUCOSE: 103 mg/dL — AB (ref 65–99)
HCT: 47 % (ref 39.0–52.0)
HEMOGLOBIN: 16 g/dL (ref 13.0–17.0)
Potassium: 4.5 mmol/L (ref 3.5–5.1)
SODIUM: 140 mmol/L (ref 135–145)
TCO2: 29 mmol/L (ref 0–100)

## 2015-12-11 MED ORDER — MAGNESIUM SULFATE 2 GM/50ML IV SOLN
2.0000 g | Freq: Once | INTRAVENOUS | Status: AC
  Administered 2015-12-11: 2 g via INTRAVENOUS
  Filled 2015-12-11: qty 50

## 2015-12-11 MED ORDER — SODIUM CHLORIDE 0.9 % IV BOLUS (SEPSIS)
1000.0000 mL | Freq: Once | INTRAVENOUS | Status: AC
  Administered 2015-12-11: 1000 mL via INTRAVENOUS

## 2015-12-11 MED ORDER — BUTALBITAL-APAP-CAFFEINE 50-325-40 MG PO TABS: 1.0000 | ORAL_TABLET | Freq: Four times a day (QID) | ORAL | Status: DC | PRN

## 2015-12-11 MED ORDER — PROCHLORPERAZINE EDISYLATE 5 MG/ML IJ SOLN
10.0000 mg | Freq: Once | INTRAMUSCULAR | Status: AC
  Administered 2015-12-11: 10 mg via INTRAVENOUS
  Filled 2015-12-11: qty 2

## 2015-12-11 MED ORDER — DEXAMETHASONE SODIUM PHOSPHATE 10 MG/ML IJ SOLN
10.0000 mg | Freq: Once | INTRAMUSCULAR | Status: AC
  Administered 2015-12-11: 10 mg via INTRAVENOUS
  Filled 2015-12-11: qty 1

## 2015-12-11 NOTE — Patient Instructions (Signed)
Please fill the amoxicillin prescription and start taking that twice a day. Also take the nasal spray daily. Keep her appointment with the ear nose and throat specialist.   Sinusitis, Adult Sinusitis is redness, soreness, and inflammation of the paranasal sinuses. Paranasal sinuses are air pockets within the bones of your face. They are located beneath your eyes, in the middle of your forehead, and above your eyes. In healthy paranasal sinuses, mucus is able to drain out, and air is able to circulate through them by way of your nose. However, when your paranasal sinuses are inflamed, mucus and air can become trapped. This can allow bacteria and other germs to grow and cause infection. Sinusitis can develop quickly and last only a short time (acute) or continue over a long period (chronic). Sinusitis that lasts for more than 12 weeks is considered chronic. CAUSES Causes of sinusitis include:  Allergies.  Structural abnormalities, such as displacement of the cartilage that separates your nostrils (deviated septum), which can decrease the air flow through your nose and sinuses and affect sinus drainage.  Functional abnormalities, such as when the small hairs (cilia) that line your sinuses and help remove mucus do not work properly or are not present. SIGNS AND SYMPTOMS Symptoms of acute and chronic sinusitis are the same. The primary symptoms are pain and pressure around the affected sinuses. Other symptoms include:  Upper toothache.  Earache.  Headache.  Bad breath.  Decreased sense of smell and taste.  A cough, which worsens when you are lying flat.  Fatigue.  Fever.  Thick drainage from your nose, which often is green and may contain pus (purulent).  Swelling and warmth over the affected sinuses. DIAGNOSIS Your health care provider will perform a physical exam. During your exam, your health care provider may perform any of the following to help determine if you have acute  sinusitis or chronic sinusitis:  Look in your nose for signs of abnormal growths in your nostrils (nasal polyps).  Tap over the affected sinus to check for signs of infection.  View the inside of your sinuses using an imaging device that has a light attached (endoscope). If your health care provider suspects that you have chronic sinusitis, one or more of the following tests may be recommended:  Allergy tests.  Nasal culture. A sample of mucus is taken from your nose, sent to a lab, and screened for bacteria.  Nasal cytology. A sample of mucus is taken from your nose and examined by your health care provider to determine if your sinusitis is related to an allergy. TREATMENT Most cases of acute sinusitis are related to a viral infection and will resolve on their own within 10 days. Sometimes, medicines are prescribed to help relieve symptoms of both acute and chronic sinusitis. These may include pain medicines, decongestants, nasal steroid sprays, or saline sprays. However, for sinusitis related to a bacterial infection, your health care provider will prescribe antibiotic medicines. These are medicines that will help kill the bacteria causing the infection. Rarely, sinusitis is caused by a fungal infection. In these cases, your health care provider will prescribe antifungal medicine. For some cases of chronic sinusitis, surgery is needed. Generally, these are cases in which sinusitis recurs more than 3 times per year, despite other treatments. HOME CARE INSTRUCTIONS  Drink plenty of water. Water helps thin the mucus so your sinuses can drain more easily.  Use a humidifier.  Inhale steam 3-4 times a day (for example, sit in the bathroom with the  shower running).  Apply a warm, moist washcloth to your face 3-4 times a day, or as directed by your health care provider.  Use saline nasal sprays to help moisten and clean your sinuses.  Take medicines only as directed by your health care  provider.  If you were prescribed either an antibiotic or antifungal medicine, finish it all even if you start to feel better. SEEK IMMEDIATE MEDICAL CARE IF:  You have increasing pain or severe headaches.  You have nausea, vomiting, or drowsiness.  You have swelling around your face.  You have vision problems.  You have a stiff neck.  You have difficulty breathing.   This information is not intended to replace advice given to you by your health care provider. Make sure you discuss any questions you have with your health care provider.   Document Released: 11/13/2005 Document Revised: 12/04/2014 Document Reviewed: 11/28/2011 Elsevier Interactive Patient Education [employer].

## 2015-12-11 NOTE — Discharge Instructions (Signed)
Please follow with your neurologist next week.  Please follow with your primary care doctor in the next 5 days for high blood pressure evaluation. If you do not have a primary care doctor, present to urgent care. Reduce salt intake. Seek emergency medical care for unilateral weakness, slurring, change in vision, or chest pain and shortness of breath.  Please follow with your primary care doctor in the next 2 days for a check-up. They must obtain records for further management.   Do not hesitate to return to the Emergency Department for any new, worsening or concerning symptoms.   General Headache Without Cause A headache is pain or discomfort felt around the head or neck area. There are many causes and types of headaches. In some cases, the cause may not be found.  HOME CARE  Managing Pain  Take over-the-counter and prescription medicines only as told by your doctor.  Lie down in a dark, quiet room when you have a headache.  If directed, apply ice to the head and neck area:  Put ice in a plastic bag.  Place a towel between your skin and the bag.  Leave the ice on for 20 minutes, 2-3 times per day.  Use a heating pad or hot shower to apply heat to the head and neck area as told by your doctor.  Keep lights dim if bright lights bother you or make your headaches worse. Eating and Drinking  Eat meals on a regular schedule.  Lessen how much alcohol you drink.  Lessen how much caffeine you drink, or stop drinking caffeine. General Instructions  Keep all follow-up visits as told by your doctor. This is important.  Keep a journal to find out if certain things bring on headaches. For example, write down:  What you eat and drink.  How much sleep you get.  Any change to your diet or medicines.  Relax by getting a massage or doing other relaxing activities.  Lessen stress.  Sit up straight. Do not tighten (tense) your muscles.  Do not use tobacco products. This includes  cigarettes, chewing tobacco, or e-cigarettes. If you need help quitting, ask your doctor.  Exercise regularly as told by your doctor.  Get enough sleep. This often means 7-9 hours of sleep. GET HELP IF:  Your symptoms are not helped by medicine.  You have a headache that feels different than the other headaches.  You feel sick to your stomach (nauseous) or you throw up (vomit).  You have a fever. GET HELP RIGHT AWAY IF:   Your headache becomes really bad.  You keep throwing up.  You have a stiff neck.  You have trouble seeing.  You have trouble speaking.  You have pain in the eye or ear.  Your muscles are weak or you lose muscle control.  You lose your balance or have trouble walking.  You feel like you will pass out (faint) or you pass out.  You have confusion.   This information is not intended to replace advice given to you by your health care provider. Make sure you discuss any questions you have with your health care provider.   Document Released: 08/22/2008 Document Revised: 08/04/2015 Document Reviewed: 03/08/2015 Elsevier Interactive Patient Education [employer].

## 2015-12-11 NOTE — Progress Notes (Signed)
Patient ID: Ronald Davenport, male   DOB: 1951-05-25, 65 y.o.   MRN: [id number]   This chart was scribed for Robyn Haber, MD by [facility], medical scribe at [facility].The patient was seen in exam room 04 and the patient's care was started at 4:18 PM.  Patient ID: Ronald Davenport MRN: [id number], DOB: 10-30-1951, 66 y.o. Date of Encounter: 12/11/2015  Primary Physician: Leandrew Koyanagi, MD  Chief Complaint:  Chief Complaint  Patient presents with  . Headache    Pt. went to ER this morning for severe headache and elevated BP.   HPI:  Ronald Davenport is a 65 y.o. male who presents to [facility] for a follow up regarding a ER visit this morning. He had a severe headache and elevated BP. The headache has improved but he is still dizzy. He has an appointment with ENT next week. MRI on 11/17/2015 showed severe bilateral chronic maxillary, ethmoid and frontal sinusitis. Currently unemployed due to back pain.  Past Medical History  Diagnosis Date  . Arthritis   . Adrenal adenoma     left , serial CT scans by urology  . Hematuria     gross and microscopic, followed by [facility], neg cystoscopy  . BPH (benign prostatic hyperplasia)     with BOO, on finasteride   . BPH with elevated PSA     PSA elevated in 2011, prostate bx in 2011 was negative   . Hematospermia   . Headache    Home Meds: Prior to Admission medications   Medication Sig Start Date End Date Taking? Authorizing Provider  butalbital-acetaminophen-caffeine (FIORICET) 50-325-40 MG tablet Take 1 tablet by mouth every 6 (six) hours as needed for headache. 12/11/15  Yes Nicole Pisciotta, PA-C  finasteride (PROSCAR) 5 MG tablet Take 5 mg by mouth daily. Reported on 11/16/2015   Yes Historical Provider, MD  HYDROcodone-acetaminophen (NORCO/VICODIN) 5-325 MG tablet Take 1 tablet by mouth every 6 (six) hours as needed for moderate pain. Dental abscess 11/16/15  Yes Leandrew Koyanagi,  MD  ibuprofen (ADVIL,MOTRIN) 200 MG tablet Take 600 mg by mouth every 6 (six) hours as needed for headache.   Yes Historical Provider, MD  meloxicam (MOBIC) 15 MG tablet Take 1 tablet (15 mg total) by mouth daily. For shoulder pain 11/16/15  Yes Leandrew Koyanagi, MD    Allergies: No Known Allergies  Social History   Social History  . Marital Status: Married    Spouse Name: N/A  . Number of Children: 3  . Years of Education: College    Occupational History  . disability     DDD lumbar/chronic back pain   Social History Main Topics  . Smoking status: Never Smoker   . Smokeless tobacco: Never Used  . Alcohol Use: 0.0 oz/week    0 Standard drinks or equivalent per week     Comment: occasionally - maybe 1-2 times per year.  . Drug Use: No  . Sexual Activity: Yes   Other Topics Concern  . Not on file   Social History Narrative   From [place]; [place] since 1980s.   Lives at home with family.   Right-handed.   No caffeine use.    Review of Systems: Constitutional: negative for chills, fever, night sweats, weight changes, or fatigue  HEENT: negative for vision changes, hearing loss, congestion, rhinorrhea, ST, epistaxis, or sinus pressure Cardiovascular: negative for chest pain or palpitations Respiratory: negative for hemoptysis, wheezing,  shortness of breath, or cough Abdominal: negative for abdominal pain, nausea, vomiting, diarrhea, or constipation Dermatological: negative for rash Neurologic: negative for syncope. Positive for a headache and dizziness.  All other systems reviewed and are otherwise negative with the exception to those above and in the HPI.  Physical Exam: Blood pressure 130/80, pulse 77, temperature 98.2 F (36.8 C), temperature source Oral, resp. rate 20, height 5\' 7"  (1.702 m), weight 158 lb 3.2 oz (71.759 kg), SpO2 96 %., Body mass index is 24.77 kg/(m^2). General: Well developed, well nourished, in no acute distress. Head: Normocephalic, atraumatic,  eyes without discharge, sclera non-icteric, nares are without discharge. Bilateral auditory canals clear, TM's are without perforation, pearly grey and translucent with reflective cone of light bilaterally. Oral cavity moist, posterior pharynx without exudate, erythema, peritonsillar abscess, or post nasal drip. Dense cataract on the right eye  Neck: Supple. No thyromegaly. Full ROM. No lymphadenopathy. Lungs: Clear bilaterally to auscultation without wheezes, rales, or rhonchi. Breathing is unlabored. Heart: RRR with S1 S2. No murmurs, rubs, or gallops appreciated. Abdomen: Soft, non-tender, non-distended with normoactive bowel sounds. No hepatomegaly. No rebound/guarding. No obvious abdominal masses. Msk:  Strength and tone normal for age. Extremities/Skin: Warm and dry. No clubbing or cyanosis. No edema. No rashes or suspicious lesions. Neuro: Alert and oriented X 3. Moves all extremities spontaneously. Gait is normal. CNII-XII grossly in tact. Psych:  Responds to questions appropriately with a normal affect.   Labs:MRI shows diffuse sinus infection  ASSESSMENT AND PLAN:  65 y.o. year old male with sinusitis  By signing my name below, I, Nadim Abuhashem, attest that this documentation has been prepared under the direction and in the presence of Robyn Haber, MD.  Electronically Signed: Lora Havens, medical scribe. 12/11/2015 4:25 PM.    This chart was scribed in my presence and reviewed by me personally.    ICD-9-CM ICD-10-CM   1. Acute maxillary sinusitis, recurrence not specified 461.0 J01.00    Take amoxicillin and Flonase as prescribed  Signed, Robyn Haber, MD

## 2015-12-11 NOTE — ED Provider Notes (Signed)
CSN: [id number]     Arrival date & time 12/11/15  0305 History   First MD Initiated Contact with Patient 12/11/15 [phone]     Chief Complaint  Patient presents with  . Headache     (Consider location/radiation/quality/duration/timing/severity/associated sxs/prior Treatment) HPI  Blood pressure 168/83, pulse 67, temperature 99.1 F (37.3 C), temperature source Oral, resp. rate 16, height 5\' 7"  (1.702 m), weight 70.761 kg, SpO2 100 %.  Ronald Davenport is a 65 y.o. male complaining of headache to the crown of head onset yesterday afternoon. It started out mild and progressively worsened over 24 hours. He took ibuprofen this afternoon with some relief but then it returned. Pain is rated at 8 out of 10, cannot describe the quality. No exacerbating or alleviating factors identified. Patient has history of similar headaches, he was evaluated by neurology about 6 months ago and was cleared after he had negative imaging. Pt denies fever, rash, confusion, cervicalgia, LOC/syncope, change in vision, N/V, numbness, weakness, dysarthria, ataxia, thunderclap onset, exacerbation with exertion or valsalva, exacerbation in morning, CP, SOB, abdominal pain. Patient checked his blood pressure this afternoon and found to be elevated, he doesn't have a history of hypertension and takes no medications for this. He follows regularly with primary care.   Past Medical History  Diagnosis Date  . Arthritis   . Adrenal adenoma     left , serial CT scans by urology  . Hematuria     gross and microscopic, followed by [facility], neg cystoscopy  . BPH (benign prostatic hyperplasia)     with BOO, on finasteride   . BPH with elevated PSA     PSA elevated in 2011, prostate bx in 2011 was negative   . Hematospermia   . Headache    Past Surgical History  Procedure Laterality Date  . Cataract extraction Left 2006  . Prostate biopsy      negative in 2011  . Shoulder surgery Right 2006   Family History  Problem  Relation Age of Onset  . Colon cancer Brother   . Stroke Mother    Social History  Substance Use Topics  . Smoking status: Never Smoker   . Smokeless tobacco: Never Used  . Alcohol Use: 0.0 oz/week    0 Standard drinks or equivalent per week     Comment: occasionally - maybe 1-2 times per year.    Review of Systems  10 systems reviewed and found to be negative, except as noted in the HPI.   Allergies  Review of patient's allergies indicates no known allergies.  Home Medications   Prior to Admission medications   Medication Sig Start Date End Date Taking? Authorizing Provider  finasteride (PROSCAR) 5 MG tablet Take 5 mg by mouth daily. Reported on 11/16/2015   Yes Historical Provider, MD  HYDROcodone-acetaminophen (NORCO/VICODIN) 5-325 MG tablet Take 1 tablet by mouth every 6 (six) hours as needed for moderate pain. Dental abscess 11/16/15  Yes Leandrew Koyanagi, MD  ibuprofen (ADVIL,MOTRIN) 200 MG tablet Take 600 mg by mouth every 6 (six) hours as needed for headache.   Yes Historical Provider, MD  meloxicam (MOBIC) 15 MG tablet Take 1 tablet (15 mg total) by mouth daily. For shoulder pain 11/16/15  Yes Leandrew Koyanagi, MD  amoxicillin (AMOXIL) 875 MG tablet Take 1 tablet (875 mg total) by mouth 2 (two) times daily. For 3 weeks Patient not taking: Reported on 12/11/2015 11/16/15   Leandrew Koyanagi, MD  fluticasone [facility]) 50 MCG/ACT  nasal spray 1 spray each nostril twice a day Patient not taking: Reported on 12/11/2015 11/16/15   Leandrew Koyanagi, MD   BP 168/83 mmHg  Pulse 67  Temp(Src) 99.1 F (37.3 C) (Oral)  Resp 16  Ht 5\' 7"  (1.702 m)  Wt 70.761 kg  BMI 24.43 kg/m2  SpO2 100% Physical Exam  Constitutional: He is oriented to person, place, and time. He appears well-developed and well-nourished.  HENT:  Head: Normocephalic and atraumatic.  Mouth/Throat: Oropharynx is clear and moist.  Eyes: Conjunctivae and EOM are normal. Pupils are equal, round, and  reactive to light.  No TTP of maxillary or frontal sinuses  No TTP or induration of temporal arteries bilaterally  Neck: Normal range of motion. Neck supple.  FROM to C-spine. Pt can touch chin to chest without discomfort. No TTP of midline cervical spine.   Cardiovascular: Normal rate, regular rhythm and intact distal pulses.   Pulmonary/Chest: Effort normal and breath sounds normal. No respiratory distress. He has no wheezes. He has no rales. He exhibits no tenderness.  Abdominal: Soft. Bowel sounds are normal. There is no tenderness.  Musculoskeletal: Normal range of motion. He exhibits no edema or tenderness.  Neurological: He is alert and oriented to person, place, and time. No cranial nerve deficit.  II-Visual fields grossly intact. III/IV/VI-Extraocular movements intact.  Pupils reactive bilaterally. V/VII-Smile symmetric, equal eyebrow raise,  facial sensation intact VIII- Hearing grossly intact IX/X-Normal gag XI-bilateral shoulder shrug XII-midline tongue extension Motor: 5/5 bilaterally with normal tone and bulk Cerebellar: Normal finger-to-nose  and normal heel-to-shin test.   Romberg negative Ambulates with a coordinated gait   Nursing note and vitals reviewed.   ED Course  Procedures (including critical care time) Labs Review Labs Reviewed - No data to display  Imaging Review No results found. I have personally reviewed and evaluated these images and lab results as part of my medical decision-making.   EKG Interpretation None      MDM   Final diagnoses:  Acute nonintractable headache, unspecified headache type  Elevated blood pressure   Filed Vitals:   12/11/15 0327 12/11/15 0343  BP: 158/81 168/83  Pulse: 66 67  Temp: 99.1 F (37.3 C)   TempSrc: Oral   Resp: 16   Height: 5\' 7"  (1.702 m)   Weight: 70.761 kg   SpO2: 100% 100%    Medications  sodium chloride 0.9 % bolus 1,000 mL (not administered)  prochlorperazine (COMPAZINE) injection 10 mg  (not administered)  magnesium sulfate IVPB 2 g 50 mL (not administered)  dexamethasone (DECADRON) injection 10 mg (not administered)    Ronald Davenport is 65 y.o. male presenting with headache consistent with prior, blood pressure is elevated today, likely secondary in response to pain. Neuro exam nonfocal, no indication for neuroimaging.   Headache completely resolved with cocktail. Will follow closely with neurology. Recommend follow with primary care physician for recheck of blood pressure this week.  Evaluation does not show pathology that would require ongoing emergent intervention or inpatient treatment. Pt is hemodynamically stable and mentating appropriately. Discussed findings and plan with patient/guardian, who agrees with care plan. All questions answered. Return precautions discussed and outpatient follow up given.   New Prescriptions   BUTALBITAL-ACETAMINOPHEN-CAFFEINE (FIORICET) 50-325-40 MG TABLET    Take 1 tablet by mouth every 6 (six) hours as needed for headache.       Monico Blitz, PA-C 12/11/15 [id number]  Leo Grosser, MD 12/11/15 [phone]

## 2015-12-11 NOTE — ED Notes (Signed)
Pt c/o HA (points to top of head) onset yesterday afternoon, pt checked his BP with family members machine and found it to be high. Pt denies sensitivity to light or sound. Denies n/v/d. Denies trauma

## 2015-12-14 ENCOUNTER — Ambulatory Visit (INDEPENDENT_AMBULATORY_CARE_PROVIDER_SITE_OTHER): Payer: Medicare Other | Admitting: Family Medicine

## 2015-12-14 VITALS — BP 122/66 | HR 74 | Temp 97.9°F | Resp 16 | Ht 67.0 in | Wt 159.0 lb

## 2015-12-14 DIAGNOSIS — J0100 Acute maxillary sinusitis, unspecified: Secondary | ICD-10-CM

## 2015-12-14 DIAGNOSIS — R42 Dizziness and giddiness: Secondary | ICD-10-CM

## 2015-12-14 LAB — POCT CBC
Granulocyte percent: 51.4 %G (ref 37–80)
HCT, POC: 40.7 % — AB (ref 43.5–53.7)
Hemoglobin: 14.3 g/dL (ref 14.1–18.1)
Lymph, poc: 1.8 (ref 0.6–3.4)
MCH, POC: 31.6 pg — AB (ref 27–31.2)
MCHC: 35.2 g/dL (ref 31.8–35.4)
MCV: 89.8 fL (ref 80–97)
MID (cbc): 0.4 (ref 0–0.9)
MPV: 7.2 fL (ref 0–99.8)
POC Granulocyte: 2.3 (ref 2–6.9)
POC LYMPH PERCENT: 40.3 % (ref 10–50)
POC MID %: 8.3 % (ref 0–12)
Platelet Count, POC: 167 10*3/uL (ref 142–424)
RBC: 4.53 M/uL — AB (ref 4.69–6.13)
RDW, POC: 13.4 %
WBC: 4.5 10*3/uL — AB (ref 4.6–10.2)

## 2015-12-14 LAB — POC MICROSCOPIC URINALYSIS (UMFC)

## 2015-12-14 LAB — POCT URINALYSIS DIP (MANUAL ENTRY)
Bilirubin, UA: NEGATIVE
Blood, UA: NEGATIVE
Glucose, UA: NEGATIVE
Ketones, POC UA: NEGATIVE
Leukocytes, UA: NEGATIVE
Nitrite, UA: NEGATIVE
Protein Ur, POC: NEGATIVE
Spec Grav, UA: 1.02
Urobilinogen, UA: 0.2
pH, UA: 6

## 2015-12-14 NOTE — Progress Notes (Signed)
Chief Complaint:  Chief Complaint  Patient presents with  . Dizziness    HPI: Ronald Davenport is a 65 y.o. male who reports to [facility] today complaining of dizziness, he has had sinus sxs and is on amoxacillin  He has nmo other sxs. It is very intermittent, he is worried it could be something else. He wants labs done. He is on meds for sinus issues and joint pain.  No ringing of the ears, no UTI sxs, no gait changes, no counfusion, HAs, CVa like sxs.  The dizziness is intermittent and can happen at anytime not just when he gets up and down or moves hi shead.   Past Medical History  Diagnosis Date  . Arthritis   . Adrenal adenoma     left , serial CT scans by urology  . Hematuria     gross and microscopic, followed by [facility], neg cystoscopy  . BPH (benign prostatic hyperplasia)     with BOO, on finasteride   . BPH with elevated PSA     PSA elevated in 2011, prostate bx in 2011 was negative   . Hematospermia   . Headache    Past Surgical History  Procedure Laterality Date  . Cataract extraction Left 2006  . Prostate biopsy      negative in 2011  . Shoulder surgery Right 2006   Social History   Social History  . Marital Status: Married    Spouse Name: N/A  . Number of Children: 3  . Years of Education: College    Occupational History  . disability     DDD lumbar/chronic back pain   Social History Main Topics  . Smoking status: Never Smoker   . Smokeless tobacco: Never Used  . Alcohol Use: 0.0 oz/week    0 Standard drinks or equivalent per week     Comment: occasionally - maybe 1-2 times per year.  . Drug Use: No  . Sexual Activity: Yes   Other Topics Concern  . None   Social History Narrative   From [place]; [place] since 1980s.   Lives at home with family.   Right-handed.   No caffeine use.   Family History  Problem Relation Age of Onset  . Colon cancer Brother   . Stroke Mother    No Known Allergies Prior to Admission medications     Medication Sig Start Date End Date Taking? Authorizing Provider  butalbital-acetaminophen-caffeine (FIORICET) 50-325-40 MG tablet Take 1 tablet by mouth every 6 (six) hours as needed for headache. 12/11/15  Yes Nicole Pisciotta, PA-C  finasteride (PROSCAR) 5 MG tablet Take 5 mg by mouth daily. Reported on 11/16/2015   Yes Historical Provider, MD  fluticasone (FLONASE) 50 MCG/ACT nasal spray Place 1 spray into both nostrils daily.   Yes Historical Provider, MD  HYDROcodone-acetaminophen (NORCO/VICODIN) 5-325 MG tablet Take 1 tablet by mouth every 6 (six) hours as needed for moderate pain. Dental abscess 11/16/15  Yes Leandrew Koyanagi, MD  ibuprofen (ADVIL,MOTRIN) 200 MG tablet Take 600 mg by mouth every 6 (six) hours as needed for headache.   Yes Historical Provider, MD  meloxicam (MOBIC) 15 MG tablet Take 1 tablet (15 mg total) by mouth daily. For shoulder pain 11/16/15  Yes Leandrew Koyanagi, MD     ROS: The patient denies fevers, chills, night sweats, unintentional weight loss, chest pain, palpitations, wheezing, dyspnea on exertion, nausea, vomiting, abdominal pain, dysuria, hematuria, melena, numbness, weakness, or tingling.   All  other systems have been reviewed and were otherwise negative with the exception of those mentioned in the HPI and as above.    PHYSICAL EXAM: Filed Vitals:   12/14/15 1956  BP: 122/66  Pulse: 74  Temp: 97.9 F (36.6 C)  Resp: 16   Body mass index is 24.9 kg/(m^2).   General: Alert, no acute distress HEENT:  Normocephalic, atraumatic, oropharynx patent. EOMI, PERRLA Erythematous throat, no exudates, TM normal, + sinus tenderness, + erythematous/boggy nasal mucosa Cardiovascular:  Regular rate and rhythm, no rubs murmurs or gallops.  No Carotid bruits, radial pulse intact. No pedal edema.  Respiratory: Clear to auscultation bilaterally.  No wheezes, rales, or rhonchi.  No cyanosis, no use of accessory musculature Abdominal: No organomegaly, abdomen is  soft and non-tender, positive bowel sounds. No masses. Skin: No rashes. Neurologic: Facial musculature symmetric. CN 2-12 grossly normal, neg dix hallpike Psychiatric: Patient acts appropriately throughout our interaction. Lymphatic: No cervical or submandibular lymphadenopathy Musculoskeletal: Gait intact. No edema, tenderness 5/5 sterngth in UE and [name], cereblallr fxn nl, 2/2 DTRs   LABS: Results for orders placed or performed in visit on 12/14/15  POCT CBC  Result Value Ref Range   WBC 4.5 (A) 4.6 - 10.2 K/uL   Lymph, poc 1.8 0.6 - 3.4   POC LYMPH PERCENT 40.3 10 - 50 %L   MID (cbc) 0.4 0 - 0.9   POC MID % 8.3 0 - 12 %M   POC Granulocyte 2.3 2 - 6.9   Granulocyte percent 51.4 37 - 80 %G   RBC 4.53 (A) 4.69 - 6.13 M/uL   Hemoglobin 14.3 14.1 - 18.1 g/dL   HCT, POC 40.7 (A) 43.5 - 53.7 %   MCV 89.8 80 - 97 fL   MCH, POC 31.6 (A) 27 - 31.2 pg   MCHC 35.2 31.8 - 35.4 g/dL   RDW, POC 13.4 %   Platelet Count, POC 167 142 - 424 K/uL   MPV 7.2 0 - 99.8 fL  POCT urinalysis dipstick  Result Value Ref Range   Color, UA yellow yellow   Clarity, UA clear clear   Glucose, UA negative negative   Bilirubin, UA negative negative   Ketones, POC UA negative negative   Spec Grav, UA 1.020    Blood, UA negative negative   pH, UA 6.0    Protein Ur, POC negative negative   Urobilinogen, UA 0.2    Nitrite, UA Negative Negative   Leukocytes, UA Negative Negative  POCT Microscopic Urinalysis ([facility])  Result Value Ref Range   WBC,UR,HPF,POC None None WBC/hpf   RBC,UR,HPF,POC Few (A) None RBC/hpf   Bacteria Few (A) None, Too numerous to count   Mucus Present (A) Absent   Epithelial Cells, UR Per Microscopy Few (A) None, Too numerous to count cells/hpf     EKG/XRAY:   Primary read interpreted by Dr. Marin Comment at [facility].   ASSESSMENT/PLAN: Encounter Diagnoses  Name Primary?  . Acute maxillary sinusitis, recurrence not specified Yes  . Dizziness and giddiness     Likely related to sinus sxs,  normal neuro exam  Orthostatics normal In house labs normal Fu prn   Gross sideeffects, risk and benefits, and alternatives of medications d/w patient. Patient is aware that all medications have potential sideeffects and we are unable to predict every sideeffect or drug-drug interaction that may occur.  Rubyann Lingle DO  12/14/2015 8:52 PM

## 2015-12-15 LAB — COMPLETE METABOLIC PANEL WITHOUT GFR
ALT: 26 U/L (ref 9–46)
Calcium: 9.3 mg/dL (ref 8.6–10.3)
Chloride: 104 mmol/L (ref 98–110)
Creat: 1.02 mg/dL (ref 0.70–1.25)
GFR, Est African American: >89 mL/min (ref >=60)
GFR, Est Non African American: 78 mL/min (ref >=60)
Total Bilirubin: 0.7 mg/dL (ref 0.2–1.2)

## 2015-12-15 LAB — COMPLETE METABOLIC PANEL WITH GFR
AST: 19 U/L (ref 10–35)
Albumin: 4 g/dL (ref 3.6–5.1)
Alkaline Phosphatase: 84 U/L (ref 40–115)
BUN: 21 mg/dL (ref 7–25)
CO2: 29 mmol/L (ref 20–31)
Glucose, Bld: 91 mg/dL (ref 65–99)
Potassium: 4 mmol/L (ref 3.5–5.3)
Sodium: 140 mmol/L (ref 135–146)
Total Protein: 6.2 g/dL (ref 6.1–8.1)

## 2016-02-08 ENCOUNTER — Ambulatory Visit (INDEPENDENT_AMBULATORY_CARE_PROVIDER_SITE_OTHER): Payer: Medicare Other | Admitting: Family Medicine

## 2016-02-08 VITALS — BP 136/78 | HR 94 | Temp 98.8°F | Resp 16 | Ht 66.5 in | Wt 157.0 lb

## 2016-02-08 DIAGNOSIS — G44209 Tension-type headache, unspecified, not intractable: Secondary | ICD-10-CM | POA: Diagnosis not present

## 2016-02-08 DIAGNOSIS — R319 Hematuria, unspecified: Secondary | ICD-10-CM | POA: Diagnosis not present

## 2016-02-08 LAB — POCT URINALYSIS DIP (MANUAL ENTRY)
BILIRUBIN UA: NEGATIVE
GLUCOSE UA: NEGATIVE
Ketones, POC UA: NEGATIVE
Leukocytes, UA: NEGATIVE
Nitrite, UA: NEGATIVE
PH UA: 7
Protein Ur, POC: NEGATIVE
RBC UA: NEGATIVE
SPEC GRAV UA: 1.02
UROBILINOGEN UA: 0.2

## 2016-02-08 MED ORDER — KETOROLAC TROMETHAMINE 60 MG/2ML IM SOLN
60.0000 mg | Freq: Once | INTRAMUSCULAR | Status: AC
  Administered 2016-02-08: 60 mg via INTRAMUSCULAR

## 2016-02-08 MED ORDER — METHYLPREDNISOLONE ACETATE 80 MG/ML IJ SUSP
80.0000 mg | Freq: Once | INTRAMUSCULAR | Status: AC
  Administered 2016-02-08: 80 mg via INTRAMUSCULAR

## 2016-02-08 NOTE — Assessment & Plan Note (Signed)
Previously examined by neuro with large w/u which was basically nml. Similar HA today and no CN or systemic deficits Toradol and Depo to alleviate.

## 2016-02-08 NOTE — Progress Notes (Signed)
Ronald Davenport is a 65 y.o. male who presents today for headaches.  Headaches - Ongoing now for past 2-3 weeks.  Similar to previous HA.  He has been evaluated by neurology in 2016 with brain MRI with was negative for acute or chronic process other than small vessel disease.  His labs including CRP/ESR were normal.  No differences between HA from previous.  Denies dysarthria, blurred vision, diplopia, weakness.    Past Medical History  Diagnosis Date  . Arthritis   . Adrenal adenoma     left , serial CT scans by urology  . Hematuria     gross and microscopic, followed by [facility], neg cystoscopy  . BPH (benign prostatic hyperplasia)     with BOO, on finasteride   . BPH with elevated PSA     PSA elevated in 2011, prostate bx in 2011 was negative   . Hematospermia   . Headache     History  Smoking status  . Never Smoker   Smokeless tobacco  . Never Used    Family History  Problem Relation Age of Onset  . Colon cancer Brother   . Stroke Mother     Current Outpatient Prescriptions on File Prior to Visit  Medication Sig Dispense Refill  . finasteride (PROSCAR) 5 MG tablet Take 5 mg by mouth daily. Reported on 11/16/2015    . fluticasone (FLONASE) 50 MCG/ACT nasal spray Place 1 spray into both nostrils daily.    . butalbital-acetaminophen-caffeine (FIORICET) 50-325-40 MG tablet Take 1 tablet by mouth every 6 (six) hours as needed for headache. (Patient not taking: Reported on 02/08/2016) 20 tablet 0  . meloxicam (MOBIC) 15 MG tablet Take 1 tablet (15 mg total) by mouth daily. For shoulder pain (Patient not taking: Reported on 02/08/2016) 30 tablet 0   No current facility-administered medications on file prior to visit.    ROS: Per HPI.  All other systems reviewed and are negative.   Physical Exam Filed Vitals:   02/08/16 1807  BP: 136/78  Pulse: 94  Temp: 98.8 F (37.1 C)  Resp: 16    Physical Examination: General appearance - alert, well appearing, and in no  distress Mental status - alert, oriented to person, place, and time Neurological - alert, oriented, normal speech, no focal findings or movement disorder noted, cranial nerves II through XII intact, DTR's normal and symmetric, motor and sensory grossly normal bilaterally    Chemistry      Component Value Date/Time   NA 140 12/14/2015 2028   K 4.0 12/14/2015 2028   CL 104 12/14/2015 2028   CO2 29 12/14/2015 2028   BUN 21 12/14/2015 2028   CREATININE 1.02 12/14/2015 2028   CREATININE 1.00 12/11/2015 0455      Component Value Date/Time   CALCIUM 9.3 12/14/2015 2028   ALKPHOS 84 12/14/2015 2028   AST 19 12/14/2015 2028   ALT 26 12/14/2015 2028   BILITOT 0.7 12/14/2015 2028      Lab Results  Component Value Date   WBC 4.5* 12/14/2015   HGB 14.3 12/14/2015   HCT 40.7* 12/14/2015   MCV 89.8 12/14/2015   PLT 219 09/17/2014   Lab Results  Component Value Date   TSH 1.650 09/24/2015   No results found for: HGBA1C

## 2016-02-08 NOTE — Patient Instructions (Signed)
     IF you received an x-ray today, you will receive an invoice from [facility]. Please contact [facility] at [phone] with questions or concerns regarding your invoice.   IF you received labwork today, you will receive an invoice from [facility]. Please contact [facility] at [phone] with questions or concerns regarding your invoice.   Our billing staff will not be able to assist you with questions regarding bills from these companies.  You will be contacted with the lab results as soon as they are available. The fastest way to get your results is to activate your My Chart account. Instructions are located on the last page of this paperwork. If you have not heard from us regarding the results in 2 weeks, please contact this office.      

## 2016-02-22 ENCOUNTER — Ambulatory Visit (INDEPENDENT_AMBULATORY_CARE_PROVIDER_SITE_OTHER): Payer: Medicare Other | Admitting: Internal Medicine

## 2016-02-22 VITALS — BP 100/68 | HR 60 | Temp 97.4°F | Resp 20 | Ht 66.5 in | Wt 153.4 lb

## 2016-02-22 DIAGNOSIS — R6883 Chills (without fever): Secondary | ICD-10-CM

## 2016-02-22 LAB — POCT URINALYSIS DIP (MANUAL ENTRY)
BILIRUBIN UA: NEGATIVE
BILIRUBIN UA: NEGATIVE
Blood, UA: NEGATIVE
GLUCOSE UA: NEGATIVE
LEUKOCYTES UA: NEGATIVE
Nitrite, UA: NEGATIVE
Spec Grav, UA: 1.02
Urobilinogen, UA: 0.2
pH, UA: 7

## 2016-02-22 LAB — POCT CBC
GRANULOCYTE PERCENT: 56.3 % (ref 37–80)
HCT, POC: 44.2 % (ref 43.5–53.7)
Hemoglobin: 15.5 g/dL (ref 14.1–18.1)
Lymph, poc: 1.5 (ref 0.6–3.4)
MCH: 31.4 pg — AB (ref 27–31.2)
MCHC: 35 g/dL (ref 31.8–35.4)
MCV: 89.8 fL (ref 80–97)
MID (cbc): 0.3 (ref 0–0.9)
MPV: 7.3 fL (ref 0–99.8)
PLATELET COUNT, POC: 153 10*3/uL (ref 142–424)
POC Granulocyte: 2.4 (ref 2–6.9)
POC LYMPH PERCENT: 36.2 %L (ref 10–50)
POC MID %: 7.5 %M (ref 0–12)
RBC: 4.92 M/uL (ref 4.69–6.13)
RDW, POC: 13.4 %
WBC: 4.2 10*3/uL — AB (ref 4.6–10.2)

## 2016-02-22 LAB — POC MICROSCOPIC URINALYSIS (UMFC)

## 2016-02-22 MED ORDER — PROMETHAZINE-DM 6.25-15 MG/5ML PO SYRP: 5.0000 mL | ORAL_SOLUTION | Freq: Four times a day (QID) | ORAL | Status: DC | PRN

## 2016-02-22 NOTE — Patient Instructions (Addendum)
excedrin migraine is medicine to try--available without prescription     IF you received an x-ray today, you will receive an invoice from [facility]. Please contact [facility] at [phone] with questions or concerns regarding your invoice.   IF you received labwork today, you will receive an invoice from [facility]. Please contact [facility] at [phone] with questions or concerns regarding your invoice.   Our billing staff will not be able to assist you with questions regarding bills from these companies.  You will be contacted with the lab results as soon as they are available. The fastest way to get your results is to activate your My Chart account. Instructions are located on the last page of this paperwork. If you have not heard from [place] regarding the results in 2 weeks, please contact this office.

## 2016-02-22 NOTE — Progress Notes (Signed)
Subjective:    Patient ID: Ronald Davenport, male    DOB: July 17, 1951, 65 y.o.   MRN: [id number] By signing my name below, I, Zola Button, attest that this documentation has been prepared under the direction and in the presence of [name], MD.  Electronically Signed: Zola Button, Medical Scribe. 02/22/2016. 6:20 PM.  HPI HPI Comments: Ronald Davenport is a 65 y.o. male who presents to the [facility] for a follow-up for headache. Patient was seen here 2 weeks ago by Dr. Awanda Mink for headache. He is still having headaches, which feel the same as when he was here 2 weeks ago. Patient reports having associated chills and diaphoresis in the mornings for 2 days. He is unsure whether he has had a fever. He states he had been feeling weak last week and also had some rhinorrhea last week. Patient denies cough, SOB, and dysuria. He typically eats one meal a day which has not changed. He notes his systolic blood pressure today in the office (100) is low for him.  He would like his urine to be rechecked Review of Systems No other current symptoms    Objective:   Physical Exam  Constitutional: He is oriented to person, place, and time. He appears well-developed and well-nourished. No distress.  HENT:  Head: Normocephalic and atraumatic.  Nose: Nose normal.  Mouth/Throat: Oropharynx is clear and moist. No oropharyngeal exudate.  Nose and throat clear.  Eyes: EOM are normal. Pupils are equal, round, and reactive to light.  Neck: Neck supple. No thyromegaly present.  Cardiovascular: Normal rate.   Pulmonary/Chest: Effort normal and breath sounds normal. No respiratory distress.  Clear to auscultation bilaterally.   Musculoskeletal: He exhibits no edema.  Lymphadenopathy:    He has no cervical adenopathy.  Neurological: He is alert and oriented to person, place, and time. No cranial nerve deficit.  Skin: Skin is warm and dry. No rash noted.  Psychiatric: He has a normal mood and  affect. His behavior is normal.  Nursing note and vitals reviewed.  BP 100/68 mmHg  Pulse 60  Temp(Src) 97.4 F (36.3 C) (Oral)  Resp 20  Ht 5' 6.5" (1.689 m)  Wt 153 lb 6.4 oz (69.582 kg)  BMI 24.39 kg/m2  SpO2 98% Repeat blood pressure 130/79  Results for orders placed or performed in visit on 02/22/16  POCT CBC  Result Value Ref Range   WBC 4.2 (A) 4.6 - 10.2 K/uL   Lymph, poc 1.5 0.6 - 3.4   POC LYMPH PERCENT 36.2 10 - 50 %L   MID (cbc) 0.3 0 - 0.9   POC MID % 7.5 0 - 12 %M   POC Granulocyte 2.4 2 - 6.9   Granulocyte percent 56.3 37 - 80 %G   RBC 4.92 4.69 - 6.13 M/uL   Hemoglobin 15.5 14.1 - 18.1 g/dL   HCT, POC 44.2 43.5 - 53.7 %   MCV 89.8 80 - 97 fL   MCH, POC 31.4 (A) 27 - 31.2 pg   MCHC 35.0 31.8 - 35.4 g/dL   RDW, POC 13.4 %   Platelet Count, POC 153 142 - 424 K/uL   MPV 7.3 0 - 99.8 fL  POCT Microscopic Urinalysis ([facility])  Result Value Ref Range   WBC,UR,HPF,POC None None WBC/hpf   RBC,UR,HPF,POC Few (A) None RBC/hpf   Bacteria Few (A) None, Too numerous to count   Mucus Present (A) Absent   Epithelial Cells, UR Per Microscopy None None,  Too numerous to count cells/hpf  POCT urinalysis dipstick  Result Value Ref Range   Color, UA yellow yellow   Clarity, UA clear clear   Glucose, UA negative negative   Bilirubin, UA negative negative   Ketones, POC UA negative negative   Spec Grav, UA 1.020    Blood, UA negative negative   pH, UA 7.0    Protein Ur, POC trace (A) negative   Urobilinogen, UA 0.2    Nitrite, UA Negative Negative   Leukocytes, UA Negative Negative        Assessment & Plan:  Headache-chronic recurrent tension-type/he has a follow-up with neurology Viral illness with mild cough  Meds ordered this encounter  Medications  . fluticasone (FLONASE) 50 MCG/ACT nasal spray    Sig: Place into the nose.  . promethazine-dextromethorphan (PROMETHAZINE-DM) 6.25-15 MG/5ML syrup    Sig: Take 5 mLs by mouth 4 (four) times daily as needed for  cough.    Dispense:  118 mL    Refill:  0     I have completed the patient encounter in its entirety as documented by the scribe, with editing by me where necessary. Jessica Seidman P. Laney Pastor, M.D.

## 2016-02-23 ENCOUNTER — Ambulatory Visit (INDEPENDENT_AMBULATORY_CARE_PROVIDER_SITE_OTHER): Payer: Medicare Other | Admitting: Adult Health

## 2016-02-23 ENCOUNTER — Encounter: Payer: Self-pay | Admitting: Adult Health

## 2016-02-23 VITALS — BP 110/72 | HR 68 | Resp 12 | Ht 66.5 in | Wt 152.0 lb

## 2016-02-23 DIAGNOSIS — R51 Headache: Secondary | ICD-10-CM

## 2016-02-23 DIAGNOSIS — R519 Headache, unspecified: Secondary | ICD-10-CM

## 2016-02-23 NOTE — Progress Notes (Signed)
PATIENT: Ronald Davenport DOB: [birth year]  REASON FOR VISIT: follow up- tension-type headaches HISTORY FROM: patient  HISTORY OF PRESENT ILLNESS: [name] is a 65 year old male with a history of tension-type headaches. He returns today for follow-up. The patient states that he's had a headache for several weeks. The patient reports that he's been to urgent care and received injections with minimal benefit. He reports that the headache is in the center of his head he describes it as a squeezing sensation. Denies photophobia or phonophobia. Denies nausea or vomiting. The patient reports he typically does not take any medication for his headaches. He reports that the headache may not always be daily but it's very frequent. The patient is very hesitant to take medication daily. He returns today for an evaluation.  HISTORY 11/15/15 (MM): [name] Is a 65 year old male with a history of tension-type headaches. He returns today for follow-up. At the last visit the patient had blood work and MRI. The patient's blood work was unremarkable. The patient's MRI was also unremarkable although it did show severe chronic maxillary, ethmoid and frontal sinusitis. The patient reports that in the last week his headaches have improved drastically. He states in the past when he got a Headache it was normally located in the top of the head. He describes his headache as a throbbing pain. He denies photophobia or phonophobia , nausea or vomiting. The patient states that he has had a cold in the last week. He states that he's had a runny nose watery eyes. He states however this is beginning to improve. He denies any new neurological symptoms. He does report that he's been having right shoulder pain for approximately 2 weeks. He has not followed up with his primary care regards to this. He returns today for an evaluation.  HISTORY 11/01/15 [facility]): Ronald Davenport is a 65 years old right-handed male, seen in refer by  his primary care physician Dr. Laney Davenport in December fifth 2016 for evaluation of severe headaches  He is a native of [name], had a history of elevated PSA, biopsy was negative, on disability due to low back pain  He denied previous history of headaches, since August 2016, correlate with this finding of abnormal adrenal gland mass, he began to have headaches, usually vortex region, pressure sometimes severe pounding headache lasting for a few hours to whole day, there was no light noise sensitivity, no nausea, he has tried over-the-counter Advil without significant improvement  I have reviewed MRI adrenal: Left adrenal lesion which demonstrates T2 hyperintensity and primarily late post-contrast enhancement. This is suspicious for a pheochromocytoma. Differential considerations include metastasis from unknown primary or less likely lipid poor adenoma.2. Caudate lobe hemangioma.  He later had further evaluation by specialist, reported mild abnormal scan of his lung, there was small lung nodule, will continue observation, repeat test in 3 months.  His headaches over all has improved, he has mild blurry vision, no dysarthria, no lateralized motor or sensory deficit  REVIEW OF SYSTEMS: Out of a complete 14 system review of symptoms, the patient complains only of the following symptoms, and all other reviewed systems are negative.  Constipation back pain, headache, numbness  ALLERGIES: No Known Allergies  HOME MEDICATIONS: Outpatient Prescriptions Prior to Visit  Medication Sig Dispense Refill  . finasteride (PROSCAR) 5 MG tablet Take 5 mg by mouth daily. Reported on 11/16/2015    . fluticasone (FLONASE) 50 MCG/ACT nasal spray Place into the nose.    . promethazine-dextromethorphan (  PROMETHAZINE-DM) 6.25-15 MG/5ML syrup Take 5 mLs by mouth 4 (four) times daily as needed for cough. 118 mL 0   No facility-administered medications prior to visit.    PAST MEDICAL HISTORY: Past Medical History    Diagnosis Date  . Arthritis   . Adrenal adenoma     left , serial CT scans by urology  . Hematuria     gross and microscopic, followed by [facility], neg cystoscopy  . BPH (benign prostatic hyperplasia)     with BOO, on finasteride   . BPH with elevated PSA     PSA elevated in 2011, prostate bx in 2011 was negative   . Hematospermia   . Headache     PAST SURGICAL HISTORY: Past Surgical History  Procedure Laterality Date  . Cataract extraction Left 2006  . Prostate biopsy      negative in 2011  . Shoulder surgery Right 2006    FAMILY HISTORY: Family History  Problem Relation Age of Onset  . Colon cancer Brother   . Stroke Mother     SOCIAL HISTORY: Social History   Social History  . Marital Status: Married    Spouse Name: N/A  . Number of Children: 3  . Years of Education: College    Occupational History  . disability     DDD lumbar/chronic back pain   Social History Main Topics  . Smoking status: Never Smoker   . Smokeless tobacco: Never Used  . Alcohol Use: 0.0 oz/week    0 Standard drinks or equivalent per week     Comment: occasionally - maybe 1-2 times per year.  . Drug Use: No  . Sexual Activity: Yes   Other Topics Concern  . Not on file   Social History Narrative   From [place]; [place] since 1980s.   Lives at home with family.   Right-handed.   No caffeine use.      PHYSICAL EXAM  Filed Vitals:   02/23/16 1501  BP: 110/72  Pulse: 68  Resp: 12  Height: 5' 6.5" (1.689 m)  Weight: 152 lb (68.947 kg)   Body mass index is 24.17 kg/(m^2).  Generalized: Well developed, in no acute distress   Neurological examination  Mentation: Alert oriented to time, place, history taking. Follows all commands speech and language fluent Cranial nerve II-XII: Pupils were equal round reactive to light. Extraocular movements were full, visual field were full on confrontational test. Facial sensation and strength were normal. Uvula tongue midline. Head  turning and shoulder shrug  were normal and symmetric. Motor: The motor testing reveals 5 over 5 strength of all 4 extremities. Good symmetric motor tone is noted throughout.  Sensory: Sensory testing is intact to soft touch on all 4 extremities. No evidence of extinction is noted.  Coordination: Cerebellar testing reveals good finger-nose-finger and heel-to-shin bilaterally.  Gait and station: Gait is normal. Tandem gait is normal. Romberg is negative. No drift is seen.  Reflexes: Deep tendon reflexes are symmetric and normal bilaterally.   DIAGNOSTIC DATA (LABS, IMAGING, TESTING) - I reviewed patient records, labs, notes, testing and imaging myself where available.  Lab Results  Component Value Date   WBC 4.2* 02/22/2016   HGB 15.5 02/22/2016   HCT 44.2 02/22/2016   MCV 89.8 02/22/2016   PLT 219 09/17/2014      Component Value Date/Time   NA 140 12/14/2015 2028   K 4.0 12/14/2015 2028   CL 104 12/14/2015 2028   CO2 29 12/14/2015 2028  GLUCOSE 91 12/14/2015 2028   BUN 21 12/14/2015 2028   CREATININE 1.02 12/14/2015 2028   CREATININE 1.00 12/11/2015 0455   CALCIUM 9.3 12/14/2015 2028   PROT 6.2 12/14/2015 2028   ALBUMIN 4.0 12/14/2015 2028   AST 19 12/14/2015 2028   ALT 26 12/14/2015 2028   ALKPHOS 84 12/14/2015 2028   BILITOT 0.7 12/14/2015 2028   GFRNONAA 78 12/14/2015 2028   GFRAA >89 12/14/2015 2028       ASSESSMENT AND PLAN 65 y.o. year old male  has a past medical history of Arthritis; Adrenal adenoma; Hematuria; BPH (benign prostatic hyperplasia); BPH with elevated PSA; Hematospermia; and Headache. here with:  1. Headache  I have suggested daily preventative medication to the patient. He is very hesitant as he does not like to take medication. I explained that doing acute treatment such as injections or infusions have offered him little benefit. He voiced understanding. I recommended Topamax 25 mg at bedtime. I provided him with a handout. He will read over this  and let me know if he decides he would like to try this. He will follow-up in 3-4 months with Dr. Krista Blue.   Ward Givens, MSN, NP-C 02/23/2016, 3:41 PM [facility] [address], [place], [place] [address] [phone]

## 2016-02-23 NOTE — Progress Notes (Signed)
I have reviewed and agreed above plan. 

## 2016-02-23 NOTE — Patient Instructions (Signed)
Consider Topamax for headache prevention - call if you would like to try this medication If your symptoms worsen or you develop new symptoms please let us know.   Topiramate tablets What is this medicine? TOPIRAMATE (toe PYRE a mate) is used to treat seizures in adults or children with epilepsy. It is also used for the prevention of migraine headaches. This medicine may be used for other purposes; ask your health care provider or pharmacist if you have questions. What should I tell my health care provider before I take this medicine? They need to know if you have any of these conditions: -bleeding disorders -cirrhosis of the liver or liver disease -diarrhea -glaucoma -kidney stones or kidney disease -low blood counts, like low white cell, platelet, or red cell counts -lung disease like asthma, obstructive pulmonary disease, emphysema -metabolic acidosis -on a ketogenic diet -schedule for surgery or a procedure -suicidal thoughts, plans, or attempt; a previous suicide attempt by you or a family member -an unusual or allergic reaction to topiramate, other medicines, foods, dyes, or preservatives -pregnant or trying to get pregnant -breast-feeding How should I use this medicine? Take this medicine by mouth with a glass of water. Follow the directions on the prescription label. Do not crush or chew. You may take this medicine with meals. Take your medicine at regular intervals. Do not take it more often than directed. Talk to your pediatrician regarding the use of this medicine in children. Special care may be needed. While this drug may be prescribed for children as young as 24 years of age for selected conditions, precautions do apply. Overdosage: If you think you have taken too much of this medicine contact a poison control center or emergency room at once. NOTE: This medicine is only for you. Do not share this medicine with others. What if I miss a dose? If you miss a dose, take it as  soon as you can. If your next dose is to be taken in less than 6 hours, then do not take the missed dose. Take the next dose at your regular time. Do not take double or extra doses. What may interact with this medicine? Do not take this medicine with any of the following medications: -probenecid This medicine may also interact with the following medications: -acetazolamide -alcohol -amitriptyline -aspirin and aspirin-like medicines -birth control pills -certain medicines for depression -certain medicines for seizures -certain medicines that treat or prevent blood clots like warfarin, enoxaparin, dalteparin, apixaban, dabigatran, and rivaroxaban -digoxin -hydrochlorothiazide -lithium -medicines for pain, sleep, or muscle relaxation -metformin -methazolamide -NSAIDS, medicines for pain and inflammation, like ibuprofen or naproxen -pioglitazone -risperidone This list may not describe all possible interactions. Give your health care provider a list of all the medicines, herbs, non-prescription drugs, or dietary supplements you use. Also tell them if you smoke, drink alcohol, or use illegal drugs. Some items may interact with your medicine. What should I watch for while using this medicine? Visit your doctor or health care professional for regular checks on your progress. Do not stop taking this medicine suddenly. This increases the risk of seizures if you are using this medicine to control epilepsy. Wear a medical identification bracelet or chain to say you have epilepsy or seizures, and carry a card that lists all your medicines. This medicine can decrease sweating and increase your body temperature. Watch for signs of deceased sweating or fever, especially in children. Avoid extreme heat, hot baths, and saunas. Be careful about exercising, especially in hot weather. Contact  your health care provider right away if you notice a fever or decrease in sweating. You should drink plenty of fluids  while taking this medicine. If you have had kidney stones in the past, this will help to reduce your chances of forming kidney stones. If you have stomach pain, with nausea or vomiting and yellowing of your eyes or skin, call your doctor immediately. You may get drowsy, dizzy, or have blurred vision. Do not drive, use machinery, or do anything that needs mental alertness until you know how this medicine affects you. To reduce dizziness, do not sit or stand up quickly, especially if you are an older patient. Alcohol can increase drowsiness and dizziness. Avoid alcoholic drinks. If you notice blurred vision, eye pain, or other eye problems, seek medical attention at once for an eye exam. The use of this medicine may increase the chance of suicidal thoughts or actions. Pay special attention to how you are responding while on this medicine. Any worsening of mood, or thoughts of suicide or dying should be reported to your health care professional right away. This medicine may increase the chance of developing metabolic acidosis. If left untreated, this can cause kidney stones, bone disease, or slowed growth in children. Symptoms include breathing fast, fatigue, loss of appetite, irregular heartbeat, or loss of consciousness. Call your doctor immediately if you experience any of these side effects. Also, tell your doctor about any surgery you plan on having while taking this medicine since this may increase your risk for metabolic acidosis. Birth control pills may not work properly while you are taking this medicine. Talk to your doctor about using an extra method of birth control. Women who become pregnant while using this medicine may enroll in the [place] Pregnancy Registry by calling [id number]. This registry collects information about the safety of antiepileptic drug use during pregnancy. What side effects may I notice from receiving this medicine? Side effects that you should  report to your doctor or health care professional as soon as possible: -allergic reactions like skin rash, itching or hives, swelling of the face, lips, or tongue -decreased sweating and/or rise in body temperature -depression -difficulty breathing, fast or irregular breathing patterns -difficulty speaking -difficulty walking or controlling muscle movements -hearing impairment -redness, blistering, peeling or loosening of the skin, including inside the mouth -tingling, pain or numbness in the hands or feet -unusual bleeding or bruising -unusually weak or tired -worsening of mood, thoughts or actions of suicide or dying Side effects that usually do not require medical attention (report to your doctor or health care professional if they continue or are bothersome): -altered taste -back pain, joint or muscle aches and pains -diarrhea, or constipation -headache -loss of appetite -nausea -stomach upset, indigestion -tremors This list may not describe all possible side effects. Call your doctor for medical advice about side effects. You may report side effects to FDA at 1-800-FDA-1088. Where should I keep my medicine? Keep out of the reach of children. Store at room temperature between 15 and 30 degrees C (59 and 86 degrees F) in a tightly closed container. Protect from moisture. Throw away any unused medicine after the expiration date. NOTE: This sheet is a summary. It may not cover all possible information. If you have questions about this medicine, talk to your doctor, pharmacist, or health care provider.    2016, [employer]vier/Gold Standard. (2013-11-17 23:17:57)

## 2016-03-15 DIAGNOSIS — R040 Epistaxis: Secondary | ICD-10-CM | POA: Insufficient documentation

## 2016-05-24 ENCOUNTER — Other Ambulatory Visit (HOSPITAL_COMMUNITY): Payer: Self-pay | Admitting: Urology

## 2016-05-24 DIAGNOSIS — D3500 Benign neoplasm of unspecified adrenal gland: Secondary | ICD-10-CM

## 2016-06-01 IMAGING — CR DG SHOULDER 2+V*L*
3 series · 3 of 3 positions shown · non-contrast
Comparison: Chest CT dated 09/02/2015

CLINICAL DATA: 64-year-old male with left shoulder pain

EXAM:
LEFT SHOULDER - 2+ VIEW

[AP (1 of 2)]
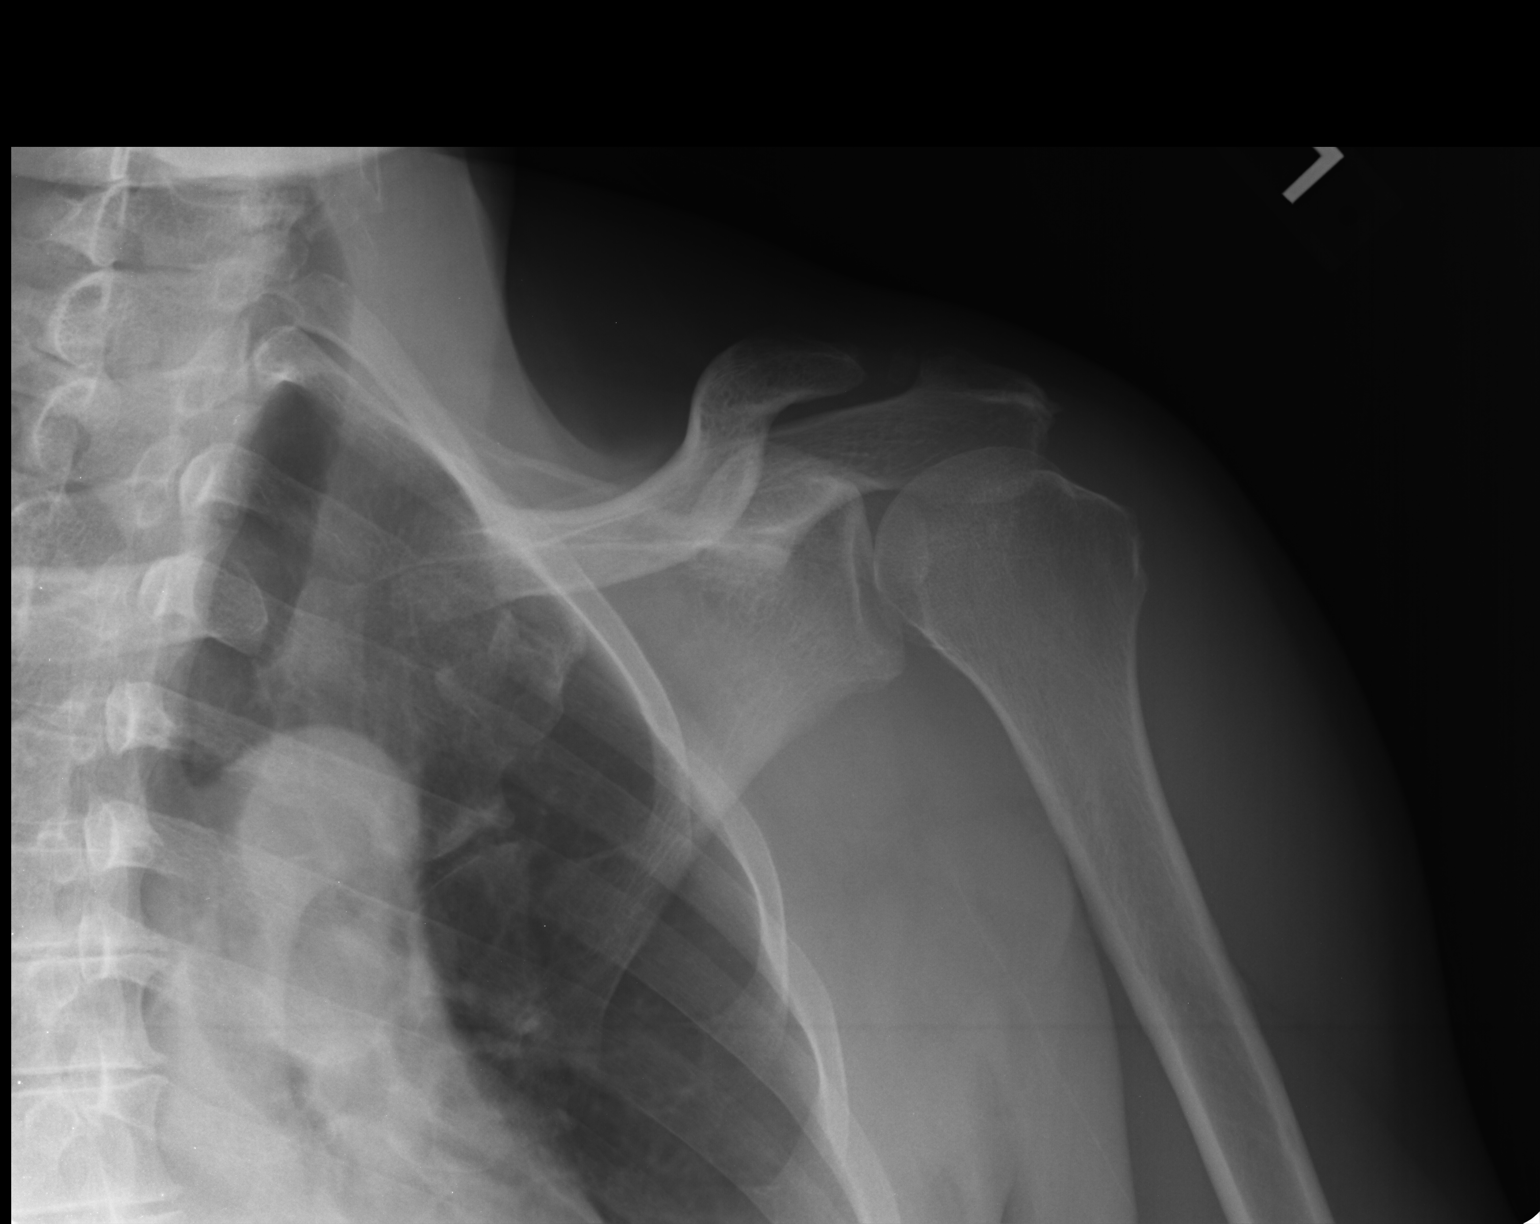

[AP (2 of 2)]
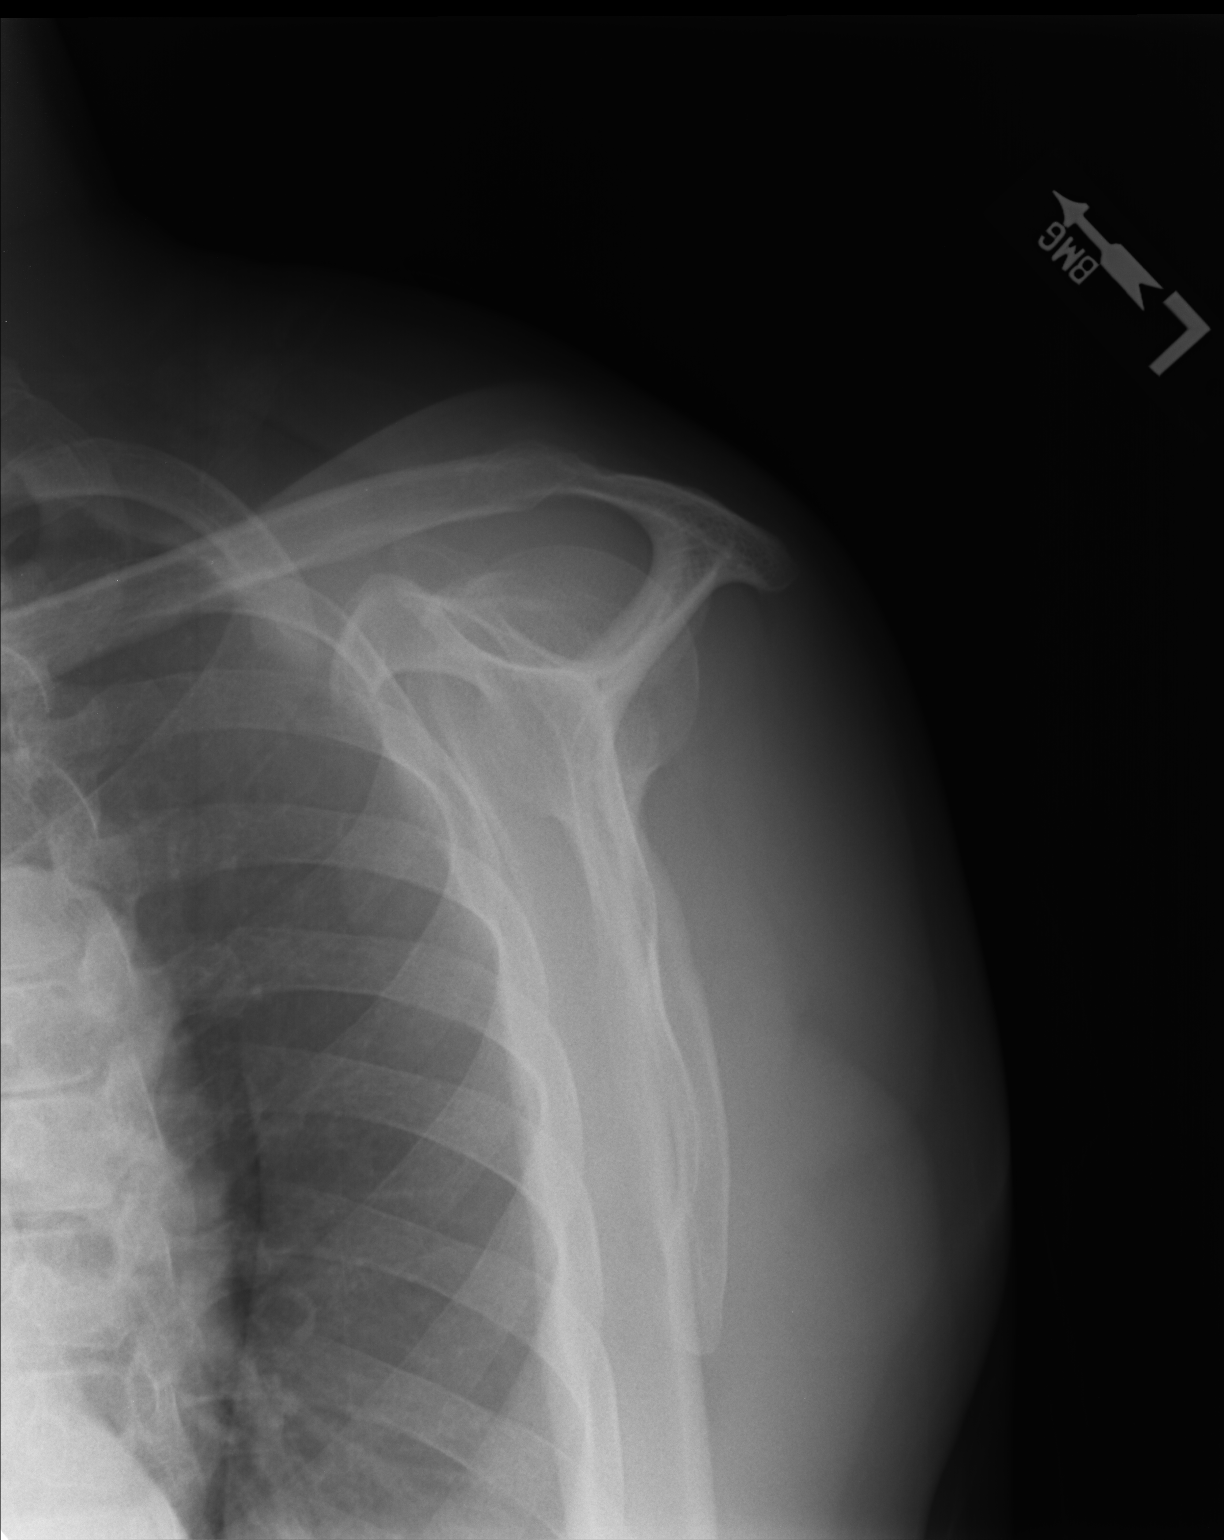

[axial]
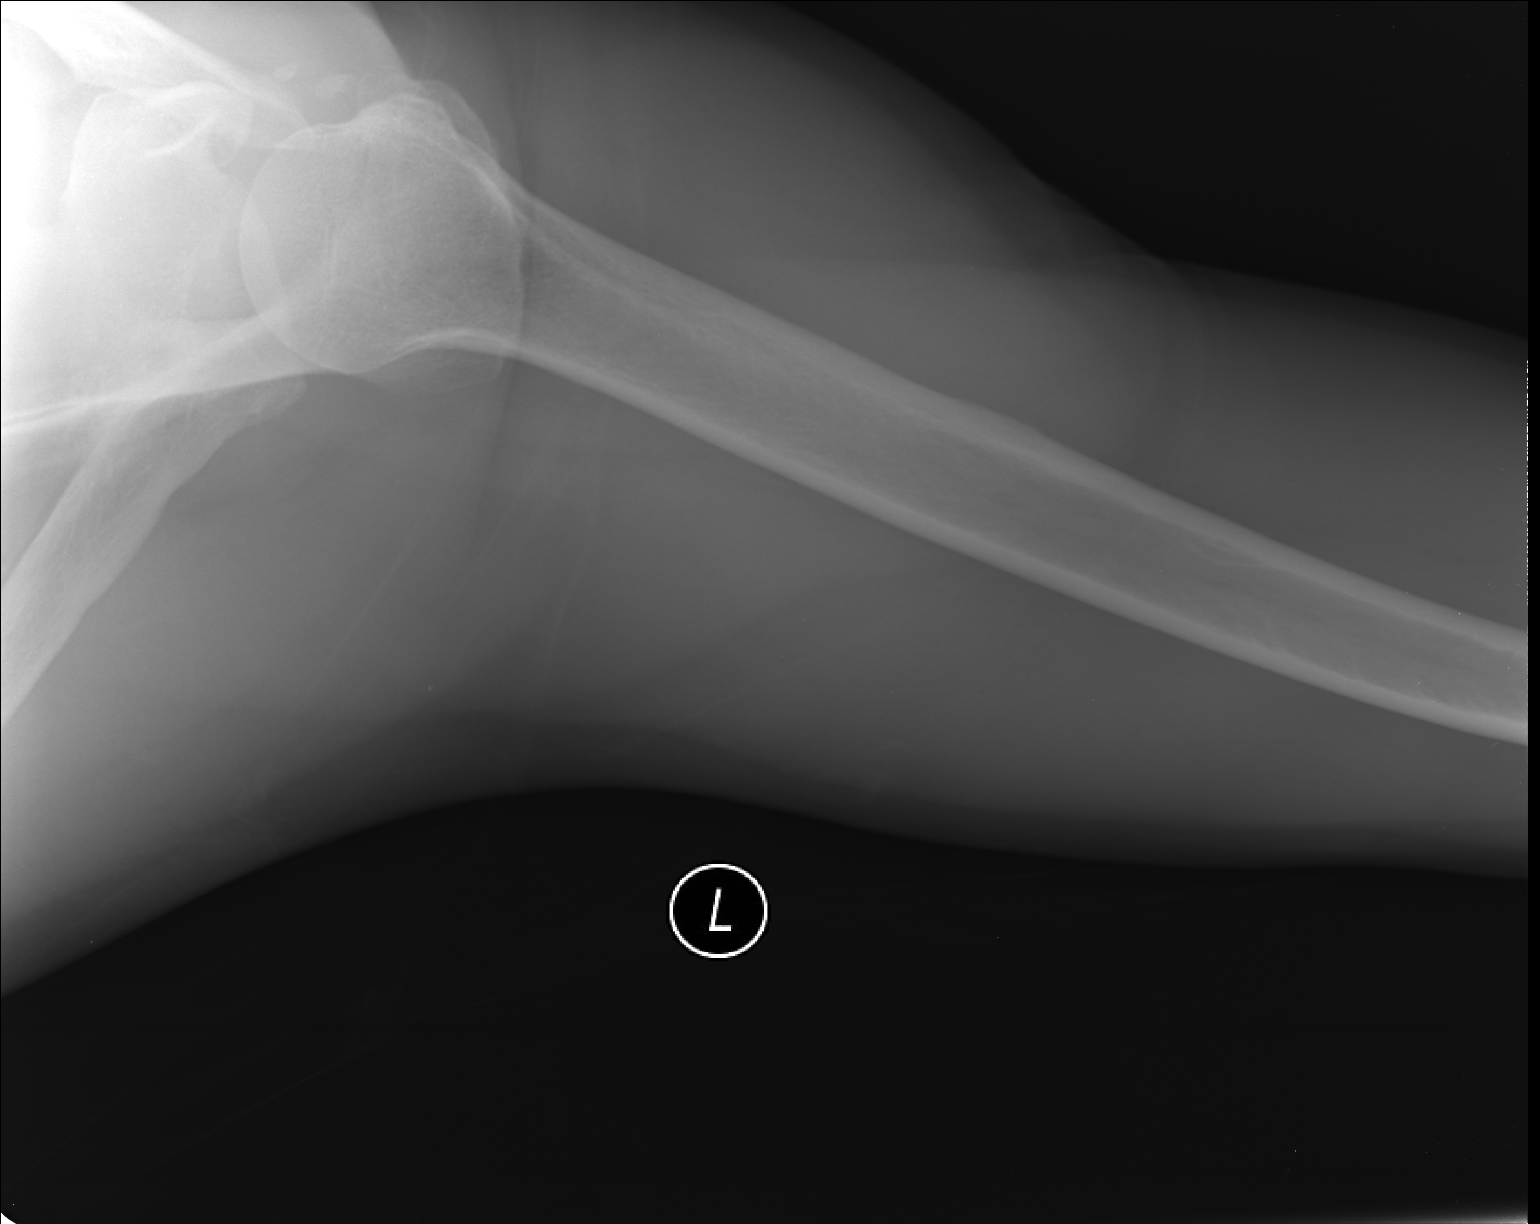

[3 of 3 positions shown; findings below may reference images not displayed]

FINDINGS: There is no evidence of fracture or dislocation. There is no
evidence of arthropathy or other focal bone abnormality. Soft
tissues are unremarkable.
IMPRESSION: Negative.

## 2016-06-06 ENCOUNTER — Encounter (HOSPITAL_COMMUNITY)
Admission: RE | Admit: 2016-06-06 | Discharge: 2016-06-06 | Disposition: A | Discharge status: Home / Self Care | Payer: Medicare Other | Source: Ambulatory Visit | Attending: Urology | Admitting: Urology

## 2016-06-06 DIAGNOSIS — D3500 Benign neoplasm of unspecified adrenal gland: Secondary | ICD-10-CM

## 2016-06-06 DIAGNOSIS — D3502 Benign neoplasm of left adrenal gland: Secondary | ICD-10-CM | POA: Insufficient documentation

## 2016-06-07 ENCOUNTER — Encounter (HOSPITAL_COMMUNITY)
Admission: RE | Admit: 2016-06-07 | Discharge: 2016-06-07 | Disposition: A | Discharge status: Home / Self Care | Payer: Medicare Other | Source: Ambulatory Visit | Attending: Urology | Admitting: Urology

## 2016-06-07 DIAGNOSIS — D3502 Benign neoplasm of left adrenal gland: Secondary | ICD-10-CM | POA: Diagnosis not present

## 2016-06-07 MED ORDER — IOBENGUANE SULFATE I 123 10 MCI/5ML IV SOLN: 10.0000 | Freq: Once | INTRAVENOUS | Status: DC | PRN

## 2016-08-14 ENCOUNTER — Encounter: Payer: Self-pay | Admitting: *Deleted

## 2016-09-06 ENCOUNTER — Encounter: Payer: Self-pay | Admitting: Internal Medicine

## 2016-12-16 ENCOUNTER — Encounter: Payer: Self-pay | Admitting: Physician Assistant

## 2016-12-27 ENCOUNTER — Ambulatory Visit (INDEPENDENT_AMBULATORY_CARE_PROVIDER_SITE_OTHER): Payer: Medicare Other | Admitting: Family Medicine

## 2016-12-27 VITALS — BP 150/90 | HR 54 | Temp 97.4°F | Resp 18 | Ht 66.0 in | Wt 163.0 lb

## 2016-12-27 DIAGNOSIS — M545 Low back pain: Secondary | ICD-10-CM | POA: Diagnosis not present

## 2016-12-27 DIAGNOSIS — R600 Localized edema: Secondary | ICD-10-CM | POA: Diagnosis not present

## 2016-12-27 MED ORDER — IBUPROFEN 800 MG PO TABS: 800.0000 mg | ORAL_TABLET | Freq: Three times a day (TID) | ORAL | 1 refills | Status: DC | PRN

## 2016-12-27 MED ORDER — CYCLOBENZAPRINE HCL 10 MG PO TABS: 10.0000 mg | ORAL_TABLET | Freq: Three times a day (TID) | ORAL | 0 refills | Status: DC | PRN

## 2016-12-27 MED ORDER — TRAMADOL HCL 50 MG PO TABS: 50.0000 mg | ORAL_TABLET | Freq: Three times a day (TID) | ORAL | 0 refills | Status: DC | PRN

## 2016-12-27 NOTE — Progress Notes (Signed)
Ronald Davenport is a 66 y.o. male who presents to [place] at [facility] today for back pain and leg edema:  1.  Back pain:  Describes aching pain in BL lumbar region of back, worse when prolonged standing.  Long-standing problem for patient in past.  Has flare 2-3 times per year.  Not worse on left versus right side. Pain is 6 / 10, better with 800 mg Ibuprofen.  No back injuries. No radiation to buttocks or lower extremity is. He does have chronic bilateral knee pain for which he is followed at outpatient pain management with fairly regular corticosteroid injections.  No LE weakness or acute changes in gait (limps at baseline b/c of chronic knee pain).  No motor weakness/decreased sensation BL LE's.  No fevers or chills.  No dysuria, hematuria, urinary frequency, incontinence of bladder or bowel.  Does have history of BPH, followed by urology.  No history of prostate cancer.   #2. Lower extremity edema: He has noted some increased lower extremity edema for the past several days. He has not had any chest pain or dyspnea. No palpitations. He is worried that Solu-Medrol with his kidneys or he may have anemia. Has noted this for the past several days. No fevers or chills. No recent illnesses. No cough.  ROS as above.    PMH reviewed. Patient is a nonsmoker.   Past Medical History:  Diagnosis Date  . Adrenal adenoma    left , serial CT scans by urology  . Arthritis   . BPH (benign prostatic hyperplasia)    with BOO, on finasteride   . BPH with elevated PSA    PSA elevated in 2011, prostate bx in 2011 was negative   . Headache   . Hematospermia   . Hematuria    gross and microscopic, followed by [facility], neg cystoscopy   Past Surgical History:  Procedure Laterality Date  . CATARACT EXTRACTION Left 2006  . PROSTATE BIOPSY     negative in 2011  . SHOULDER SURGERY Right 2006    Medications reviewed. Current Outpatient Prescriptions  Medication Sig Dispense Refill  .  finasteride (PROSCAR) 5 MG tablet Take 5 mg by mouth daily. Reported on 11/16/2015    . mupirocin ointment (BACTROBAN) 2 % Place 1 application into the nose 2 (two) times daily.     No current facility-administered medications for this visit.      Physical Exam:  BP (!) 150/90 (BP Location: Right Arm, Patient Position: Sitting, Cuff Size: Large)   Pulse (!) 54   Temp 97.4 F (36.3 C) (Oral)   Resp 18   Ht 5\' 6"  (1.676 m)   Wt 163 lb (73.9 kg)   SpO2 96%   BMI 26.31 kg/m  Gen:  Alert, cooperative patient who appears stated age in no acute distress.  Vital signs reviewed. HEENT: EOMI,  MMM Pulm:  Clear to auscultation bilaterally with good air movement.  No wheezes or rales noted.   Cardiac:  Regular rate and rhythm without murmur auscultated.  Good S1/S2. Back:  Normal skin, Spine with normal alignment and no deformity.  No tenderness to vertebral process palpation.  Paraspinous muscles are tender and with mild spasm on Left.   Range of motion is full at neck and decreased forward flexion in lumbar sacral regions secondary to pain.  Straight leg raise is positive for back pain on th left. Neuro:  Sensation and motor function 5/5 bilateral lower extremities.      Assessment  and Plan:  1.  Back pain: Lumbar strain.  No red flags.  Plan of rest, intermittent application of cold packs (later, may switch to heat, but do not sleep on heating pad), analgesics and muscle relaxants. Discussed longer term treatment plan of prn NSAID's and home back care exercise program with flexion exercise routine.   Tramadol for severe pain -- Ibuprofen for mild/moderate.   FU if no improvement.  2.  LE edema:  - mild, difficult to appreciate on exam.  - Reassured patient.  He was particularly worried -- will check CMET and CBC today to further reassure

## 2016-12-27 NOTE — Patient Instructions (Addendum)
It was very good to see you again today.  Take the flexeril as a muscle relaxer and tramadol when you have severe pain in your back.  If the pain is only mild you can continue taking the ibuprofen.  I am checking your blood work today to see if there are any signs of something that may be causing your lower leg swelling. Based on my exam things look good.  I will call you with the results tomorrow.  If you're not having relief from your back pain in the next week or so, back and see [place]. If it starts getting worse despite treatment don't wait and come back sooner.    IF you received an x-ray today, you will receive an invoice from [facility]. Please contact [facility] at [phone] with questions or concerns regarding your invoice.   IF you received labwork today, you will receive an invoice from [place]. Please contact [facility] at [phone] with questions or concerns regarding your invoice.   Our billing staff will not be able to assist you with questions regarding bills from these companies.  You will be contacted with the lab results as soon as they are available. The fastest way to get your results is to activate your My Chart account. Instructions are located on the last page of this paperwork. If you have not heard from [place] regarding the results in 2 weeks, please contact this office.

## 2016-12-28 ENCOUNTER — Telehealth: Payer: Self-pay | Admitting: Family Medicine

## 2016-12-28 LAB — COMPREHENSIVE METABOLIC PANEL
ALK PHOS: 91 IU/L (ref 39–117)
ALT: 20 IU/L (ref 0–44)
AST: 18 IU/L (ref 0–40)
Albumin/Globulin Ratio: 1.9 (ref 1.2–2.2)
Albumin: 4.1 g/dL (ref 3.6–4.8)
BUN/Creatinine Ratio: 17 (ref 10–24)
BUN: 17 mg/dL (ref 8–27)
Bilirubin Total: 0.4 mg/dL (ref 0.0–1.2)
CO2: 24 mmol/L (ref 18–29)
CREATININE: 0.98 mg/dL (ref 0.76–1.27)
Calcium: 9.1 mg/dL (ref 8.6–10.2)
Chloride: 104 mmol/L (ref 96–106)
GFR calc Af Amer: 94 mL/min/{1.73_m2} (ref >59)
GFR calc non Af Amer: 81 mL/min/{1.73_m2} (ref >59)
GLOBULIN, TOTAL: 2.2 g/dL (ref 1.5–4.5)
GLUCOSE: 86 mg/dL (ref 65–99)
Potassium: 4.1 mmol/L (ref 3.5–5.2)
SODIUM: 141 mmol/L (ref 134–144)
Total Protein: 6.3 g/dL (ref 6.0–8.5)

## 2016-12-28 LAB — TSH: TSH: 1.92 u[IU]/mL (ref 0.450–4.500)

## 2016-12-28 LAB — CBC
HEMATOCRIT: 42.7 % (ref 37.5–51.0)
Hemoglobin: 14.5 g/dL (ref 13.0–17.7)
MCH: 31 pg (ref 26.6–33.0)
MCHC: 34 g/dL (ref 31.5–35.7)
MCV: 91 fL (ref 79–97)
PLATELETS: 198 10*3/uL (ref 150–379)
RBC: 4.67 x10E6/uL (ref 4.14–5.80)
RDW: 13.5 % (ref 12.3–15.4)
WBC: 3.5 10*3/uL (ref 3.4–10.8)

## 2016-12-28 NOTE — Telephone Encounter (Signed)
Pt called gave him message and put a copy in the mail for patient

## 2016-12-28 NOTE — Telephone Encounter (Signed)
Called and had to leave voicemail for patient to call us back.  Did not leave details on voicemail.  When he returns call, please let him know that all of his labs look great, and that the little amount of leg swelling he has is from gravity and NOT from a problem with his bloodwork.

## 2017-01-05 ENCOUNTER — Ambulatory Visit (INDEPENDENT_AMBULATORY_CARE_PROVIDER_SITE_OTHER): Payer: Medicare Other | Admitting: Family Medicine

## 2017-01-05 ENCOUNTER — Ambulatory Visit (INDEPENDENT_AMBULATORY_CARE_PROVIDER_SITE_OTHER): Payer: Medicare Other

## 2017-01-05 VITALS — BP 110/72 | HR 66 | Temp 97.5°F | Resp 18 | Ht 66.0 in | Wt 160.0 lb

## 2017-01-05 DIAGNOSIS — G8929 Other chronic pain: Secondary | ICD-10-CM | POA: Insufficient documentation

## 2017-01-05 DIAGNOSIS — M5442 Lumbago with sciatica, left side: Secondary | ICD-10-CM

## 2017-01-05 DIAGNOSIS — M25562 Pain in left knee: Secondary | ICD-10-CM | POA: Insufficient documentation

## 2017-01-05 MED ORDER — DICLOFENAC SODIUM 75 MG PO TBEC: 75.0000 mg | DELAYED_RELEASE_TABLET | Freq: Two times a day (BID) | ORAL | 0 refills | Status: DC

## 2017-01-05 NOTE — Patient Instructions (Addendum)
Take diclofenac 75 mg one twice daily with food.  Try to avoid excessive activity and weightbearing for the next few days to rest her knee  Keep your appointment with the pain clinic.   If the medication and/or whenever care you get at the pain clinic does not resolve the problem, contact this office and we can make you an appointment to an orthopedic doctor to get your knee checked also, up or return here as needed.    IF you received an x-ray today, you will receive an invoice from [facility]. Please contact [facility] at [phone] with questions or concerns regarding your invoice.   IF you received labwork today, you will receive an invoice from [place]. Please contact [facility] at [phone] with questions or concerns regarding your invoice.   Our billing staff will not be able to assist you with questions regarding bills from these companies.  You will be contacted with the lab results as soon as they are available. The fastest way to get your results is to activate your My Chart account. Instructions are located on the last page of this paperwork. If you have not heard from [place] regarding the results in 2 weeks, please contact this office.

## 2017-01-05 NOTE — Progress Notes (Signed)
Patient ID: Roudy Bonnette, male    DOB: 06-29-1951  Age: 66 y.o. MRN: [id number]  Chief Complaint  Patient presents with  . Knee Pain    left    Subjective:   66 year old gentleman who is no longer working. He was in here recently with some swelling in the ankle and low back pain. Laboratory testing was done was normal. He has a history of having had knee pain problems in the past, and over the years has had injections in his knees. The last few days the left knee has gotten progressively worse. He has a hard time weightbearing. It hurts around the knee but deep in the joint and around to the popliteal area of the knee. He feels better and is back and ankle edema has improved. He has schedule an appointment with the pain management clinic for Monday.  Current allergies, medications, problem list, past/family and social histories reviewed.  Objective:  BP 110/72 (BP Location: Right Arm, Patient Position: Sitting, Cuff Size: Small)   Pulse 66   Temp 97.5 F (36.4 C) (Oral)   Resp 18   Ht 5\' 6"  (1.676 m)   Wt 160 lb (72.6 kg)   SpO2 98%   BMI 25.82 kg/m   Significant pain, difficulty when he stands and walks. Lumbar spine is mildly tender in the LS region. The spine has fair range of motion. Ankles do not have edema today. Left knee has a small effusion. No crepitance. Very tender in the popliteal and lateral portions of the knee. Minimal calf tenderness.  Assessment & Plan:   Assessment: 1. Acute pain of left knee   2. Chronic left-sided low back pain with left-sided sciatica       Plan: X-ray knee  Orders Placed This Encounter  Procedures  . DG Knee Complete 4 Views Left    Standing Status:   Future    Number of Occurrences:   1    Standing Expiration Date:   01/05/2018    Order Specific Question:   Reason for Exam (SYMPTOM  OR DIAGNOSIS REQUIRED)    Answer:   KNEE PAIN    Order Specific Question:   Preferred imaging location?    Answer:   External    No orders of the  defined types were placed in this encounter.        Patient Instructions   Take diclofenac 75 mg one twice daily with food.  Try to avoid excessive activity and weightbearing for the next few days to rest her knee  Keep your appointment with the pain clinic.   If the medication and/or whenever care you get at the pain clinic does not resolve the problem, contact this office and we can make you an appointment to an orthopedic doctor to get your knee checked also, up or return here as needed.    IF you received an x-ray today, you will receive an invoice from [facility]. Please contact [facility] at [phone] with questions or concerns regarding your invoice.   IF you received labwork today, you will receive an invoice from [place]. Please contact [facility] at [phone] with questions or concerns regarding your invoice.   Our billing staff will not be able to assist you with questions regarding bills from these companies.  You will be contacted with the lab results as soon as they are available. The fastest way to get your results is to activate your My Chart account. Instructions are located on the last page of  this paperwork. If you have not heard from [place] regarding the results in 2 weeks, please contact this office.        Return if symptoms worsen or fail to improve.   Beatris Belen, MD 01/05/2017

## 2017-01-08 ENCOUNTER — Ambulatory Visit: Payer: Medicare Other

## 2017-02-20 ENCOUNTER — Ambulatory Visit (INDEPENDENT_AMBULATORY_CARE_PROVIDER_SITE_OTHER): Payer: Medicare Other | Admitting: Physician Assistant

## 2017-02-20 VITALS — BP 147/75 | HR 68 | Temp 97.8°F | Resp 16 | Ht 66.0 in | Wt 156.0 lb

## 2017-02-20 DIAGNOSIS — M62838 Other muscle spasm: Secondary | ICD-10-CM

## 2017-02-20 MED ORDER — CYCLOBENZAPRINE HCL 10 MG PO TABS
10.0000 mg | ORAL_TABLET | Freq: Three times a day (TID) | ORAL | 0 refills | Status: DC | PRN
Start: 2017-02-20 — End: 2017-05-15

## 2017-02-20 MED ORDER — MELOXICAM 7.5 MG PO TABS: 7.5000 mg | ORAL_TABLET | Freq: Every day | ORAL | 0 refills | Status: DC

## 2017-02-20 NOTE — Patient Instructions (Addendum)
Do not take the ibuprofen or the naproxen when you are using the mobic.  You can take tylenol Please start to heat your back at least 15 minutes 3 times per day for the next 24 hours.  You will then perform the stretches and ice directly after. Return if your symptoms are not improving after 10 days.  Make sure you are hydrating well with 64 oz of water  Ask your health care provider which exercises are safe for you. Do exercises exactly as told by your health care provider and adjust them as directed. It is normal to feel mild stretching, pulling, tightness, or discomfort as you do these exercises, but you should stop right away if you feel sudden pain or your pain gets worse.Do not begin these exercises until told by your health care provider. Stretching and range of motion exercises These exercises warm up your muscles and joints and improve the movement and flexibility of your neck. These exercises also help to relieve pain, numbness, and tingling. Exercise A: Cervical side bend   1. Using good posture, sit on a stable chair or stand up. 2. Without moving your shoulders, slowly tilt your left / right ear to your shoulder until you feel a stretch in your neck muscles. You should be looking straight ahead. 3. Hold for __________ seconds. 4. Repeat with the other side of your neck. Repeat __________ times. Complete this exercise __________ times a day. Exercise B: Cervical rotation   1. Using good posture, sit on a stable chair or stand up. 2. Slowly turn your head to the side as if you are looking over your left / right shoulder.  Keep your eyes level with the ground.  Stop when you feel a stretch along the side and the back of your neck. 3. Hold for __________ seconds. 4. Repeat this by turning to your other side. Repeat __________ times. Complete this exercise __________ times a day. Exercise C: Thoracic extension and pectoral stretch  1. Roll a towel or a small blanket so it is about  4 inches (10 cm) in diameter. 2. Lie down on your back on a firm surface. 3. Put the towel lengthwise, under your spine in the middle of your back. It should not be not under your shoulder blades. The towel should line up with your spine from your middle back to your lower back. 4. Put your hands behind your head and let your elbows fall out to your sides. 5. Hold for __________ seconds. Repeat __________ times. Complete this exercise __________ times a day. Strengthening exercises These exercises build strength and endurance in your neck. Endurance is the ability to use your muscles for a long time, even after your muscles get tired. Exercise D: Upper cervical flexion, isometric  1. Lie on your back with a thin pillow behind your head and a small rolled-up towel under your neck. 2. Gently tuck your chin toward your chest and nod your head down to look toward your feet. Do not lift your head off the pillow. 3. Hold for __________ seconds. 4. Release the tension slowly. Relax your neck muscles completely before you repeat this exercise. Repeat __________ times. Complete this exercise __________ times a day. Exercise E: Cervical extension, isometric   1. Stand about 6 inches (15 cm) away from a wall, with your back facing the wall. 2. Place a soft object, about 6-8 inches (15-20 cm) in diameter, between the back of your head and the wall. A soft object could  be a small pillow, a ball, or a folded towel. 3. Gently tilt your head back and press into the soft object. Keep your jaw and forehead relaxed. 4. Hold for __________ seconds. 5. Release the tension slowly. Relax your neck muscles completely before you repeat this exercise. Repeat __________ times. Complete this exercise __________ times a day. Posture and body mechanics   Body mechanics refers to the movements and positions of your body while you do your daily activities. Posture is part of body mechanics. Good posture and healthy body  mechanics can help to relieve stress in your body's tissues and joints. Good posture means that your spine is in its natural S-curve position (your spine is neutral), your shoulders are pulled back slightly, and your head is not tipped forward. The following are general guidelines for applying improved posture and body mechanics to your everyday activities. Standing   When standing, keep your spine neutral and keep your feet about hip-width apart. Keep a slight bend in your knees. Your ears, shoulders, and hips should line up.  When you do a task in which you stand in one place for a long time, place one foot up on a stable object that is 2-4 inches (5-10 cm) high, such as a footstool. This helps keep your spine neutral. Sitting    When sitting, keep your spine neutral and your keep feet flat on the floor. Use a footrest, if necessary, and keep your thighs parallel to the floor. Avoid rounding your shoulders, and avoid tilting your head forward.  When working at a desk or a computer, keep your desk at a height where your hands are slightly lower than your elbows. Slide your chair under your desk so you are close enough to maintain good posture.  When working at a computer, place your monitor at a height where you are looking straight ahead and you do not have to tilt your head forward or downward to look at the screen. Resting  When lying down and resting, avoid positions that are most painful for you. Try to support your neck in a neutral position. You can use a contour pillow or a small rolled-up towel. Your pillow should support your neck but not push on it. This information is not intended to replace advice given to you by your health care provider. Make sure you discuss any questions you have with your health care provider. Document Released: 11/13/2005 Document Revised: 07/20/2016 Document Reviewed: 10/20/2015 Elsevier Interactive Patient Education  2017 [employer].    IF you received  an x-ray today, you will receive an invoice from [facility]. Please contact [facility] at [phone] with questions or concerns regarding your invoice.   IF you received labwork today, you will receive an invoice from [place]. Please contact [facility] at [phone] with questions or concerns regarding your invoice.   Our billing staff will not be able to assist you with questions regarding bills from these companies.  You will be contacted with the lab results as soon as they are available. The fastest way to get your results is to activate your My Chart account. Instructions are located on the last page of this paperwork. If you have not heard from [place] regarding the results in 2 weeks, please contact this office.

## 2017-02-20 NOTE — Progress Notes (Signed)
PRIMARY CARE AT [place], [place] [address] [phone]  Date:  02/20/2017   Name:  Timmothy Baranowski   DOB:  [birth year]   MRN:  [id number]  PCP:  No PCP Per Patient    History of Present Illness:  An Schnabel is a 66 y.o. male patient who presents to PCP with  Chief Complaint  Patient presents with  . Back Pain  . Shoulder Pain    Right     2 days ago patient started with right sided shoulder pain but then migrated to the left side as he was headed to church.     Pain started on the right side and migrating to the left side.  Pain radiates to the back of the shoulder.  He has used diclofenac cream which has helped.  No numbness or tingling.   No heavy lifting.  He has not changed where he is sleeping or laying in an odd positiion.   He is not taking anything at this time.  He took 2 advils.     Patient Active Problem List   Diagnosis Date Noted  . Acute pain of left knee 01/05/2017  . Chronic left-sided low back pain with left-sided sciatica 01/05/2017  . Bleeding from the nose 03/15/2016  . Adrenal adenoma 11/16/2015  . Headache 11/01/2015  . GERD (gastroesophageal reflux disease) 11/28/2014  . Chronic back pain 11/27/2014  . BPH (benign prostatic hyperplasia) 09/07/2014  . Elevated PSA 02/12/2013    Past Medical History:  Diagnosis Date  . Adrenal adenoma    left , serial CT scans by urology  . Arthritis   . BPH (benign prostatic hyperplasia)    with BOO, on finasteride   . BPH with elevated PSA    PSA elevated in 2011, prostate bx in 2011 was negative   . Headache   . Hematospermia   . Hematuria    gross and microscopic, followed by [facility], neg cystoscopy    Past Surgical History:  Procedure Laterality Date  . CATARACT EXTRACTION Left 2006  . PROSTATE BIOPSY     negative in 2011  . SHOULDER SURGERY Right 2006    Social History  Substance Use Topics  . Smoking status: Never Smoker  . Smokeless tobacco: Never Used  .  Alcohol use 0.0 oz/week     Comment: occasionally - maybe 1-2 times per year.    Family History  Problem Relation Age of Onset  . Stroke Mother   . Colon cancer Brother     No Known Allergies  Medication list has been reviewed and updated.  Current Outpatient Prescriptions on File Prior to Visit  Medication Sig Dispense Refill  . finasteride (PROSCAR) 5 MG tablet Take 5 mg by mouth daily. Reported on 11/16/2015    . ibuprofen (ADVIL,MOTRIN) 800 MG tablet Take 1 tablet (800 mg total) by mouth every 8 (eight) hours as needed. 30 tablet 1   No current facility-administered medications on file prior to visit.     ROS ROS otherwise unremarkable unless listed above.  Physical Examination: BP (!) 147/75   Pulse 68   Temp 97.8 F (36.6 C) (Oral)   Resp 16   Ht 5\' 6"  (1.676 m)   Wt 156 lb (70.8 kg)   SpO2 99%   BMI 25.18 kg/m  Ideal Body Weight: Weight in (lb) to have BMI = 25: 154.6  Physical Exam  Constitutional: He is oriented to person, place, and time. He appears well-developed  and well-nourished. No distress.  HENT:  Head: Normocephalic and atraumatic.  Eyes: Conjunctivae and EOM are normal. Pupils are equal, round, and reactive to light.  Cardiovascular: Normal rate and regular rhythm.  Exam reveals no friction rub.   No murmur heard. Pulmonary/Chest: Effort normal and breath sounds normal. No respiratory distress.  Musculoskeletal:       Right shoulder: He exhibits tenderness and spasm (diffusely along the trapezius.).  Cervical decreased rom with horizontal rotation, lateral deviation, forward flexion.    Neurological: He is alert and oriented to person, place, and time.  Skin: Skin is warm and dry. He is not diaphoretic.  Psychiatric: He has a normal mood and affect. His behavior is normal.     Assessment and Plan: Rahmir Beever is a 66 y.o. male who is here today for upper back pain. Spasms apparent.  Given flexeril and daily nsaid.  Warned of  precautions. rtc if sxs do not improve  Trapezius muscle spasm - Plan: cyclobenzaprine (FLEXERIL) 10 MG tablet, meloxicam (MOBIC) 7.5 MG tablet  Ivar Drape, PA-C [facility] 3/27/20186:43 PM

## 2017-05-15 ENCOUNTER — Ambulatory Visit (INDEPENDENT_AMBULATORY_CARE_PROVIDER_SITE_OTHER): Payer: Medicare Other | Admitting: Family Medicine

## 2017-05-15 ENCOUNTER — Encounter: Payer: Self-pay | Admitting: Family Medicine

## 2017-05-15 VITALS — BP 110/60 | HR 71 | Temp 97.4°F | Resp 16 | Ht 67.0 in | Wt 158.4 lb

## 2017-05-15 DIAGNOSIS — M549 Dorsalgia, unspecified: Secondary | ICD-10-CM | POA: Diagnosis not present

## 2017-05-15 DIAGNOSIS — R5383 Other fatigue: Secondary | ICD-10-CM | POA: Diagnosis not present

## 2017-05-15 DIAGNOSIS — R400 Somnolence: Secondary | ICD-10-CM | POA: Diagnosis not present

## 2017-05-15 LAB — POCT URINALYSIS DIP (MANUAL ENTRY)
Bilirubin, UA: NEGATIVE
Glucose, UA: NEGATIVE mg/dL
Ketones, POC UA: NEGATIVE mg/dL
Leukocytes, UA: NEGATIVE
NITRITE UA: NEGATIVE
PH UA: 6 (ref 5.0–8.0)
Protein Ur, POC: NEGATIVE mg/dL
Spec Grav, UA: 1.025 (ref 1.010–1.025)
UROBILINOGEN UA: 0.2 U/dL

## 2017-05-15 MED ORDER — DICLOFENAC SODIUM 75 MG PO TBEC: DELAYED_RELEASE_TABLET | ORAL | 1 refills | Status: DC

## 2017-05-15 MED ORDER — METHOCARBAMOL 500 MG PO TABS: ORAL_TABLET | ORAL | 1 refills | Status: DC

## 2017-05-15 NOTE — Progress Notes (Signed)
Patient ID: Audiel Scheiber, male    DOB: [birth year]  Age: 66 y.o. MRN: [id number]  Chief Complaint  Patient presents with  . Back Pain    upper left shoulder x 2 mos ago, right lower back comes and goes  . Feeling tired and sleepy    "maybe coming down with something"    Subjective:   Patient is here having a lot of problems with pain in the left upper back. No specific injury. It hurts deep under the scapula and up into the trapezius areas. He has had injections in the past that of help elsewhere. He has tried medications several times in the past.  He also has been having problems with just feeling tired and sleepy and thought he might be coming down with something. He has not done recent travel. He is not employed, but after he gets up in the morning and goes to [employer] about 4 days a week.    Current allergies, medications, problem list, past/family and social histories reviewed.  Objective:  BP 110/60   Pulse 71   Temp 97.4 F (36.3 C) (Oral)   Resp 16   Ht 5\' 7"  (1.702 m)   Wt 158 lb 6.4 oz (71.8 kg)   SpO2 100%   BMI 24.81 kg/m   Strong appearing gentleman in no major acute distress. When he moves his left arm or shoulder however his left upper back hurts him considerably. He is tender along the top of the scapula and down the medial aspect underneath the scapula. Of left arm is painful. There is a small amount of pain on the right side also but nothing like the left. Chest is clear. Heart regular without murmurs. Soft and nontender.  Assessment & Plan:   Assessment: 1. Upper back pain on left side   2. Fatigue, unspecified type   3. Drowsiness       Plan: Check some labs for his nonspecific fatigue and sleepiness.  Treat with anti-inflammatory and muscle asked medication. If problems continue to persist can refer him to a sports medicine person for further evaluation.  Orders Placed This Encounter  Procedures  . CBC  . Comprehensive metabolic panel  .  POCT urinalysis dipstick    Meds ordered this encounter  Medications  . diclofenac (VOLTAREN) 75 MG EC tablet    Sig: Take 1 tablet twice daily at breakfast and supper for pain and inflammation in back    Dispense:  30 tablet    Refill:  1  . methocarbamol (ROBAXIN) 500 MG tablet    Sig: Take 1 tablet (500 mg) 4 times daily as needed for muscle relaxant    Dispense:  40 tablet    Refill:  1        Patient Instructions   Take the diclofenac one twice daily for pain and inflammation and shoulder, take with food.  Take the methocarbamol 1 pill up to 4 times daily for muscle relaxant  If you continue to have a lot of trouble with pain in that shoulder and upper back region, we should make a referral to a sports medicine specialist for you.  I do not know why you're feeling run down and sleepy and drowsy. We will check your labs and I will let you know the results of them in a few days.  Return as needed    IF you received an x-ray today, you will receive an invoice from [facility]. Please contact [facility] at [phone]  with questions or concerns regarding your invoice.   IF you received labwork today, you will receive an invoice from [place]. Please contact [facility] at [phone] with questions or concerns regarding your invoice.   Our billing staff will not be able to assist you with questions regarding bills from these companies.  You will be contacted with the lab results as soon as they are available. The fastest way to get your results is to activate your My Chart account. Instructions are located on the last page of this paperwork. If you have not heard from [place] regarding the results in 2 weeks, please contact this office.        Return if symptoms worsen or fail to improve.   HOPPER,DAVID, MD 05/15/2017

## 2017-05-15 NOTE — Patient Instructions (Addendum)
Take the diclofenac one twice daily for pain and inflammation and shoulder, take with food.  Take the methocarbamol 1 pill up to 4 times daily for muscle relaxant  If you continue to have a lot of trouble with pain in that shoulder and upper back region, we should make a referral to a sports medicine specialist for you.  I do not know why you're feeling run down and sleepy and drowsy. We will check your labs and I will let you know the results of them in a few days.  Return as needed    IF you received an x-ray today, you will receive an invoice from [facility]. Please contact [facility] at [phone] with questions or concerns regarding your invoice.   IF you received labwork today, you will receive an invoice from [facility] [facility]. Please contact [facility] at [phone] with questions or concerns regarding your invoice.   Our billing staff will not be able to assist you with questions regarding bills from these companies.  You will be contacted with the lab results as soon as they are available. The fastest way to get your results is to activate your My Chart account. Instructions are located on the last page of this paperwork. If you have not heard from [place] regarding the results in 2 weeks, please contact this office.

## 2017-05-16 LAB — COMPREHENSIVE METABOLIC PANEL
A/G RATIO: 1.9 (ref 1.2–2.2)
ALBUMIN: 4.2 g/dL (ref 3.6–4.8)
ALK PHOS: 100 IU/L (ref 39–117)
ALT: 15 IU/L (ref 0–44)
AST: 17 IU/L (ref 0–40)
BILIRUBIN TOTAL: 0.4 mg/dL (ref 0.0–1.2)
BUN / CREAT RATIO: 14 (ref 10–24)
BUN: 17 mg/dL (ref 8–27)
CHLORIDE: 104 mmol/L (ref 96–106)
CO2: 28 mmol/L (ref 20–29)
Calcium: 9.3 mg/dL (ref 8.6–10.2)
Creatinine, Ser: 1.18 mg/dL (ref 0.76–1.27)
GFR calc non Af Amer: 65 mL/min/{1.73_m2} (ref >59)
GFR, EST AFRICAN AMERICAN: 75 mL/min/{1.73_m2} (ref >59)
Globulin, Total: 2.2 g/dL (ref 1.5–4.5)
Glucose: 94 mg/dL (ref 65–99)
POTASSIUM: 4.5 mmol/L (ref 3.5–5.2)
SODIUM: 143 mmol/L (ref 134–144)
TOTAL PROTEIN: 6.4 g/dL (ref 6.0–8.5)

## 2017-05-16 LAB — CBC
HEMOGLOBIN: 14.5 g/dL (ref 13.0–17.7)
Hematocrit: 42.4 % (ref 37.5–51.0)
MCH: 31.6 pg (ref 26.6–33.0)
MCHC: 34.2 g/dL (ref 31.5–35.7)
MCV: 92 fL (ref 79–97)
PLATELETS: 217 10*3/uL (ref 150–379)
RBC: 4.59 x10E6/uL (ref 4.14–5.80)
RDW: 13.2 % (ref 12.3–15.4)
WBC: 3.4 10*3/uL (ref 3.4–10.8)

## 2017-05-28 ENCOUNTER — Ambulatory Visit: Payer: Medicare Other | Admitting: Physician Assistant

## 2017-05-29 ENCOUNTER — Ambulatory Visit (INDEPENDENT_AMBULATORY_CARE_PROVIDER_SITE_OTHER): Payer: Medicare Other

## 2017-05-29 ENCOUNTER — Ambulatory Visit (INDEPENDENT_AMBULATORY_CARE_PROVIDER_SITE_OTHER): Payer: Medicare Other | Admitting: Physician Assistant

## 2017-05-29 ENCOUNTER — Encounter: Payer: Self-pay | Admitting: Physician Assistant

## 2017-05-29 VITALS — BP 148/77 | HR 62 | Temp 98.8°F | Resp 16 | Ht 67.0 in | Wt 157.2 lb

## 2017-05-29 DIAGNOSIS — M791 Myalgia, unspecified site: Secondary | ICD-10-CM

## 2017-05-29 DIAGNOSIS — G8929 Other chronic pain: Secondary | ICD-10-CM

## 2017-05-29 DIAGNOSIS — M25512 Pain in left shoulder: Secondary | ICD-10-CM

## 2017-05-29 MED ORDER — IBUPROFEN 800 MG PO TABS: 800.0000 mg | ORAL_TABLET | Freq: Three times a day (TID) | ORAL | 0 refills | Status: DC | PRN

## 2017-05-29 NOTE — Patient Instructions (Addendum)
I would like you to ice the area three times per day. I am referring you to an orthopedist please await the phone call.  You can return to the painmanagement for injection at this time.    IF you received an x-ray today, you will receive an invoice from [facility]. Please contact [facility] at [phone] with questions or concerns regarding your invoice.   IF you received labwork today, you will receive an invoice from [place]. Please contact [facility] at [phone] with questions or concerns regarding your invoice.   Our billing staff will not be able to assist you with questions regarding bills from these companies.  You will be contacted with the lab results as soon as they are available. The fastest way to get your results is to activate your My Chart account. Instructions are located on the last page of this paperwork. If you have not heard from [place] regarding the results in 2 weeks, please contact this office.

## 2017-05-29 NOTE — Progress Notes (Signed)
PRIMARY CARE AT [place], [place] [address] [phone]  Date:  05/29/2017   Name:  Ronald Davenport   DOB:  July 24, 1951   MRN:  [id number]  PCP:  Patient, No Pcp Per    History of Present Illness:  Ronald Davenport is a 66 y.o. male patient who presents to PCP with  Chief Complaint  Patient presents with  . Shoulder Pain    states the pain has not gotten better since last visit     Patient has pain of the left shoulder, and hard to lift, and lifting over the shoulder.  He has not felt anythign swollen.  He has had some tingling of his arm.  No hx of trauma.  He has upper back pain for years, that migrates from upper back, to right side to left side.   He denies any heavy lifting.   He has seen [facility].     Patient Active Problem List   Diagnosis Date Noted  . Acute pain of left knee 01/05/2017  . Chronic left-sided low back pain with left-sided sciatica 01/05/2017  . Bleeding from the nose 03/15/2016  . Adrenal adenoma 11/16/2015  . Headache 11/01/2015  . GERD (gastroesophageal reflux disease) 11/28/2014  . Chronic back pain 11/27/2014  . BPH (benign prostatic hyperplasia) 09/07/2014  . Elevated PSA 02/12/2013    Past Medical History:  Diagnosis Date  . Adrenal adenoma    left , serial CT scans by urology  . Arthritis   . BPH (benign prostatic hyperplasia)    with BOO, on finasteride   . BPH with elevated PSA    PSA elevated in 2011, prostate bx in 2011 was negative   . Headache   . Hematospermia   . Hematuria    gross and microscopic, followed by [facility], neg cystoscopy    Past Surgical History:  Procedure Laterality Date  . CATARACT EXTRACTION Left 2006  . PROSTATE BIOPSY     negative in 2011  . SHOULDER SURGERY Right 2006    Social History  Substance Use Topics  . Smoking status: Never Smoker  . Smokeless tobacco: Never Used  . Alcohol use 0.0 oz/week     Comment: occasionally - maybe 1-2 times per year.    Family  History  Problem Relation Age of Onset  . Stroke Mother   . Colon cancer Brother     No Known Allergies  Medication list has been reviewed and updated.  Current Outpatient Prescriptions on File Prior to Visit  Medication Sig Dispense Refill  . diclofenac (VOLTAREN) 75 MG EC tablet Take 1 tablet twice daily at breakfast and supper for pain and inflammation in back 30 tablet 1  . finasteride (PROSCAR) 5 MG tablet Take 5 mg by mouth daily. Reported on 11/16/2015    . methocarbamol (ROBAXIN) 500 MG tablet Take 1 tablet (500 mg) 4 times daily as needed for muscle relaxant 40 tablet 1   No current facility-administered medications on file prior to visit.     ROS ROS otherwise unremarkable unless listed above.  Physical Examination: BP (!) 148/77   Pulse 62   Temp 98.8 F (37.1 C) (Oral)   Resp 16   Ht 5\' 7"  (1.702 m)   Wt 157 lb 3.2 oz (71.3 kg)   SpO2 100%   BMI 24.62 kg/m  Ideal Body Weight: Weight in (lb) to have BMI = 25: 159.3  Physical Exam  Constitutional: He is oriented to person,  place, and time. He appears well-developed and well-nourished. No distress.  HENT:  Head: Normocephalic and atraumatic.  Eyes: Conjunctivae and EOM are normal. Pupils are equal, round, and reactive to light.  Cardiovascular: Normal rate.   Pulmonary/Chest: Effort normal. No respiratory distress.  Musculoskeletal:       Left shoulder: He exhibits decreased range of motion (external rotation and internal). He exhibits no swelling and no spasm.  Tender along the posterior thoracic just medial of inferior region of the scapula  Neurological: He is alert and oriented to person, place, and time.  Skin: Skin is warm and dry. He is not diaphoretic.  Psychiatric: He has a normal mood and affect. His behavior is normal.    Dg Shoulder Left  Result Date: 05/29/2017 CLINICAL DATA:  Left shoulder pain. EXAM: LEFT SHOULDER - 2+ VIEW COMPARISON:  None. FINDINGS: Probable prior resection of the distal  left clavicle versus bony resorption. No acute bony abnormality. No fracture, subluxation or dislocation. Glenohumeral joint is maintained. IMPRESSION: Probable prior resection of the distal left clavicle. No acute bony abnormality. Electronically Signed   By: Rolm Baptise M.D.   On: 05/29/2017 15:31    Assessment and Plan: Ronald Davenport is a 66 y.o. male who is here today for cc of left shoulder and upper back pain.   Appears possible strain or spasm. Advised anti-inflammatory and icing three times per day for 15 minutes. If no improvement in 2 weeks, rtc  Muscle pain - Plan: ibuprofen (ADVIL,MOTRIN) 800 MG tablet  Chronic left shoulder pain - Plan: DG Shoulder Left, ibuprofen (ADVIL,MOTRIN) 800 MG tablet  Ivar Drape, PA-C [facility] 7/6/20183:51 PM

## 2017-06-01 DIAGNOSIS — M25562 Pain in left knee: Secondary | ICD-10-CM | POA: Diagnosis not present

## 2017-06-12 DIAGNOSIS — R972 Elevated prostate specific antigen [PSA]: Secondary | ICD-10-CM | POA: Diagnosis not present

## 2017-06-27 DIAGNOSIS — N401 Enlarged prostate with lower urinary tract symptoms: Secondary | ICD-10-CM | POA: Diagnosis not present

## 2017-06-27 DIAGNOSIS — R972 Elevated prostate specific antigen [PSA]: Secondary | ICD-10-CM | POA: Diagnosis not present

## 2017-08-27 ENCOUNTER — Ambulatory Visit: Payer: Medicare Other | Admitting: Internal Medicine

## 2017-08-28 DIAGNOSIS — M25512 Pain in left shoulder: Secondary | ICD-10-CM | POA: Diagnosis not present

## 2017-09-28 DIAGNOSIS — M25562 Pain in left knee: Secondary | ICD-10-CM | POA: Diagnosis not present

## 2017-09-28 DIAGNOSIS — M25512 Pain in left shoulder: Secondary | ICD-10-CM | POA: Diagnosis not present

## 2017-09-28 DIAGNOSIS — M545 Low back pain: Secondary | ICD-10-CM | POA: Diagnosis not present

## 2017-09-28 DIAGNOSIS — G894 Chronic pain syndrome: Secondary | ICD-10-CM | POA: Diagnosis not present

## 2017-10-05 DIAGNOSIS — M545 Low back pain: Secondary | ICD-10-CM | POA: Diagnosis not present

## 2017-10-31 DIAGNOSIS — M5116 Intervertebral disc disorders with radiculopathy, lumbar region: Secondary | ICD-10-CM | POA: Diagnosis not present

## 2017-10-31 DIAGNOSIS — M4726 Other spondylosis with radiculopathy, lumbar region: Secondary | ICD-10-CM | POA: Diagnosis not present

## 2017-10-31 DIAGNOSIS — M4727 Other spondylosis with radiculopathy, lumbosacral region: Secondary | ICD-10-CM | POA: Diagnosis not present

## 2017-11-14 ENCOUNTER — Ambulatory Visit: Payer: Medicare Other | Admitting: Emergency Medicine

## 2017-11-15 ENCOUNTER — Encounter: Payer: Self-pay | Admitting: Physician Assistant

## 2017-11-15 ENCOUNTER — Ambulatory Visit: Payer: Medicare Other | Admitting: Physician Assistant

## 2017-11-15 ENCOUNTER — Other Ambulatory Visit: Payer: Self-pay

## 2017-11-15 VITALS — BP 168/86 | HR 71 | Temp 98.1°F | Resp 16 | Ht 67.0 in | Wt 166.0 lb

## 2017-11-15 DIAGNOSIS — R030 Elevated blood-pressure reading, without diagnosis of hypertension: Secondary | ICD-10-CM | POA: Diagnosis not present

## 2017-11-15 DIAGNOSIS — I10 Essential (primary) hypertension: Secondary | ICD-10-CM

## 2017-11-15 DIAGNOSIS — M7989 Other specified soft tissue disorders: Secondary | ICD-10-CM | POA: Diagnosis not present

## 2017-11-15 MED ORDER — HYDROCHLOROTHIAZIDE 25 MG PO TABS: 25.0000 mg | ORAL_TABLET | Freq: Every day | ORAL | 0 refills | Status: DC

## 2017-11-15 NOTE — Progress Notes (Signed)
PRIMARY CARE AT [facility] [address], [place] [address] [phone]  Date:  11/15/2017   Name:  Ronald Davenport   DOB:  1951/03/03   MRN:  [id number]  PCP:  Patient, No Pcp Per    History of Present Illness:  Ronald Davenport is a 66 y.o. male patient who presents to PCP with  Chief Complaint  Patient presents with  . Foot Swelling    both noticed sat or sunday     Patient notes that he has had lower extremity swelling for the last 4 days.  There is no pain.  He has noticed this when his sandals were tight.  He denies cp, palpitations, sob, or dizziness.  He has been eating salty corn nuts a lot, which is different from his general diet.  Patient Active Problem List   Diagnosis Date Noted  . Acute pain of left knee 01/05/2017  . Chronic left-sided low back pain with left-sided sciatica 01/05/2017  . Bleeding from the nose 03/15/2016  . Adrenal adenoma 11/16/2015  . Headache 11/01/2015  . GERD (gastroesophageal reflux disease) 11/28/2014  . Chronic back pain 11/27/2014  . BPH (benign prostatic hyperplasia) 09/07/2014  . Elevated PSA 02/12/2013    Past Medical History:  Diagnosis Date  . Adrenal adenoma    left , serial CT scans by urology  . Arthritis   . BPH (benign prostatic hyperplasia)    with BOO, on finasteride   . BPH with elevated PSA    PSA elevated in 2011, prostate bx in 2011 was negative   . Headache   . Hematospermia   . Hematuria    gross and microscopic, followed by [facility], neg cystoscopy    Past Surgical History:  Procedure Laterality Date  . CATARACT EXTRACTION Left 2006  . PROSTATE BIOPSY     negative in 2011  . SHOULDER SURGERY Right 2006    Social History   Tobacco Use  . Smoking status: Never Smoker  . Smokeless tobacco: Never Used  Substance Use Topics  . Alcohol use: Yes    Alcohol/week: 0.0 oz    Comment: occasionally - maybe 1-2 times per year.  . Drug use: No    Family History  Problem Relation Age of Onset  .  Stroke Mother   . Colon cancer Brother     No Known Allergies  Medication list has been reviewed and updated.  Current Outpatient Medications on File Prior to Visit  Medication Sig Dispense Refill  . finasteride (PROSCAR) 5 MG tablet Take 5 mg by mouth daily. Reported on 11/16/2015    . ibuprofen (ADVIL,MOTRIN) 800 MG tablet Take 1 tablet (800 mg total) by mouth every 8 (eight) hours as needed. 30 tablet 0  . diclofenac (VOLTAREN) 75 MG EC tablet Take 1 tablet twice daily at breakfast and supper for pain and inflammation in back (Patient not taking: Reported on 11/15/2017) 30 tablet 1  . methocarbamol (ROBAXIN) 500 MG tablet Take 1 tablet (500 mg) 4 times daily as needed for muscle relaxant (Patient not taking: Reported on 11/15/2017) 40 tablet 1   No current facility-administered medications on file prior to visit.     ROS ROS otherwise unremarkable unless listed above.  Physical Examination: BP (!) 170/87   Pulse 71   Temp 98.1 F (36.7 C) (Oral)   Resp 16   Ht 5' 7" (1.702 m)   Wt 166 lb (75.3 kg)   SpO2 96%   BMI 26.00 kg/m  Ideal Body Weight: Weight in (lb) to have BMI = 25: 159.3 Wt Readings from Last 3 Encounters:  11/15/17 166 lb (75.3 kg)  05/29/17 157 lb 3.2 oz (71.3 kg)  05/15/17 158 lb 6.4 oz (71.8 kg)    Physical Exam  Constitutional: He is oriented to person, place, and time. He appears well-developed and well-nourished. No distress.  HENT:  Head: Normocephalic and atraumatic.  Eyes: Conjunctivae and EOM are normal. Pupils are equal, round, and reactive to light.  Cardiovascular: Normal rate.  Pulses:      Popliteal pulses are 2+ on the right side, and 2+ on the left side.       Dorsalis pedis pulses are 2+ on the right side, and 2+ on the left side.  No swelling that is readily visualized.  Pulmonary/Chest: Effort normal. No respiratory distress.  Neurological: He is alert and oriented to person, place, and time.  Skin: Skin is warm and dry. He is not  diaphoretic.  Psychiatric: He has a normal mood and affect. His behavior is normal.     Assessment and Plan: Ronald Davenport is a 66 y.o. male who is here today for cc of  Chief Complaint  Patient presents with  . Foot Swelling    both noticed sat or sunday  he has had elevated bp multiple times present here at the office.  I will start him on anti-hypertensives.   Essential hypertension - Plan: hydrochlorothiazide (HYDRODIURIL) 25 MG tablet  Elevated blood pressure reading - Plan: CMP14+EGFR  Foot swelling - Plan: CMP14+EGFR  Ivar Drape, PA-C [facility] 12/30/20182:04 PM

## 2017-11-15 NOTE — Patient Instructions (Addendum)
Hydrochlorothiazide, HCTZ capsules or tablets What is this medicine? HYDROCHLOROTHIAZIDE (hye droe klor oh THYE a zide) is a diuretic. It increases the amount of urine passed, which causes the body to lose salt and water. This medicine is used to treat high blood pressure. It is also reduces the swelling and water retention caused by various medical conditions, such as heart, liver, or kidney disease. This medicine may be used for other purposes; ask your health care provider or pharmacist if you have questions. COMMON BRAND NAME(S): Esidrix, Ezide, HydroDIURIL, Microzide, Oretic, Zide What should I tell my health care provider before I take this medicine? They need to know if you have any of these conditions: -diabetes -gout -immune system problems, like lupus -kidney disease or kidney stones -liver disease -pancreatitis -small amount of urine or difficulty passing urine -an unusual or allergic reaction to hydrochlorothiazide, sulfa drugs, other medicines, foods, dyes, or preservatives -pregnant or trying to get pregnant -breast-feeding How should I use this medicine? Take this medicine by mouth with a glass of water. Follow the directions on the prescription label. Take your medicine at regular intervals. Remember that you will need to pass urine frequently after taking this medicine. Do not take your doses at a time of day that will cause you problems. Do not stop taking your medicine unless your doctor tells you to. Talk to your pediatrician regarding the use of this medicine in children. Special care may be needed. Overdosage: If you think you have taken too much of this medicine contact a poison control center or emergency room at once. NOTE: This medicine is only for you. Do not share this medicine with others. What if I miss a dose? If you miss a dose, take it as soon as you can. If it is almost time for your next dose, take only that dose. Do not take double or extra doses. What may  interact with this medicine? -cholestyramine -colestipol -digoxin -dofetilide -lithium -medicines for blood pressure -medicines for diabetes -medicines that relax muscles for surgery -other diuretics -steroid medicines like prednisone or cortisone This list may not describe all possible interactions. Give your health care provider a list of all the medicines, herbs, non-prescription drugs, or dietary supplements you use. Also tell them if you smoke, drink alcohol, or use illegal drugs. Some items may interact with your medicine. What should I watch for while using this medicine? Visit your doctor or health care professional for regular checks on your progress. Check your blood pressure as directed. Ask your doctor or health care professional what your blood pressure should be and when you should contact him or her. You may need to be on a special diet while taking this medicine. Ask your doctor. Check with your doctor or health care professional if you get an attack of severe diarrhea, nausea and vomiting, or if you sweat a lot. The loss of too much body fluid can make it dangerous for you to take this medicine. You may get drowsy or dizzy. Do not drive, use machinery, or do anything that needs mental alertness until you know how this medicine affects you. Do not stand or sit up quickly, especially if you are an older patient. This reduces the risk of dizzy or fainting spells. Alcohol may interfere with the effect of this medicine. Avoid alcoholic drinks. This medicine may affect your blood sugar level. If you have diabetes, check with your doctor or health care professional before changing the dose of your diabetic medicine. This medicine  can make you more sensitive to the sun. Keep out of the sun. If you cannot avoid being in the sun, wear protective clothing and use sunscreen. Do not use sun lamps or tanning beds/booths. What side effects may I notice from receiving this medicine? Side effects  that you should report to your doctor or health care professional as soon as possible: -allergic reactions such as skin rash or itching, hives, swelling of the lips, mouth, tongue, or throat -changes in vision -chest pain -eye pain -fast or irregular heartbeat -feeling faint or lightheaded, falls -gout attack -muscle pain or cramps -pain or difficulty when passing urine -pain, tingling, numbness in the hands or feet -redness, blistering, peeling or loosening of the skin, including inside the mouth -unusually weak or tired Side effects that usually do not require medical attention (report to your doctor or health care professional if they continue or are bothersome): -change in sex drive or performance -dry mouth -headache -stomach upset This list may not describe all possible side effects. Call your doctor for medical advice about side effects. You may report side effects to FDA at 1-800-FDA-1088. Where should I keep my medicine? Keep out of the reach of children. Store at room temperature between 15 and 30 degrees C (59 and 86 degrees F). Do not freeze. Protect from light and moisture. Keep container closed tightly. Throw away any unused medicine after the expiration date. NOTE: This sheet is a summary. It may not cover all possible information. If you have questions about this medicine, talk to your doctor, pharmacist, or health care provider.  2018 Elsevier/Gold Standard (2010-07-08 12:57:37)   DASH Eating Plan DASH stands for "Dietary Approaches to Stop Hypertension." The DASH eating plan is a healthy eating plan that has been shown to reduce high blood pressure (hypertension). It may also reduce your risk for type 2 diabetes, heart disease, and stroke. The DASH eating plan may also help with weight loss. What are tips for following this plan? General guidelines  Avoid eating more than 2,300 mg (milligrams) of salt (sodium) a day. If you have hypertension, you may need to reduce  your sodium intake to 1,500 mg a day.  Limit alcohol intake to no more than 1 drink a day for nonpregnant women and 2 drinks a day for men. One drink equals 12 oz of beer, 5 oz of wine, or 1 oz of hard liquor.  Work with your health care provider to maintain a healthy body weight or to lose weight. Ask what an ideal weight is for you.  Get at least 30 minutes of exercise that causes your heart to beat faster (aerobic exercise) most days of the week. Activities may include walking, swimming, or biking.  Work with your health care provider or diet and nutrition specialist (dietitian) to adjust your eating plan to your individual calorie needs. Reading food labels  Check food labels for the amount of sodium per serving. Choose foods with less than 5 percent of the Daily Value of sodium. Generally, foods with less than 300 mg of sodium per serving fit into this eating plan.  To find whole grains, look for the word "whole" as the first word in the ingredient list. Shopping  Buy products labeled as "low-sodium" or "no salt added."  Buy fresh foods. Avoid canned foods and premade or frozen meals. Cooking  Avoid adding salt when cooking. Use salt-free seasonings or herbs instead of table salt or sea salt. Check with your health care provider or pharmacist  before using salt substitutes.  Do not fry foods. Cook foods using healthy methods such as baking, boiling, grilling, and broiling instead.  Cook with heart-healthy oils, such as olive, canola, soybean, or sunflower oil. Meal planning   Eat a balanced diet that includes: ? 5 or more servings of fruits and vegetables each day. At each meal, try to fill half of your plate with fruits and vegetables. ? Up to 6-8 servings of whole grains each day. ? Less than 6 oz of lean meat, poultry, or fish each day. A 3-oz serving of meat is about the same size as a deck of cards. One egg equals 1 oz. ? 2 servings of low-fat dairy each day. ? A serving  of nuts, seeds, or beans 5 times each week. ? Heart-healthy fats. Healthy fats called Omega-3 fatty acids are found in foods such as flaxseeds and coldwater fish, like sardines, salmon, and mackerel.  Limit how much you eat of the following: ? Canned or prepackaged foods. ? Food that is high in trans fat, such as fried foods. ? Food that is high in saturated fat, such as fatty meat. ? Sweets, desserts, sugary drinks, and other foods with added sugar. ? Full-fat dairy products.  Do not salt foods before eating.  Try to eat at least 2 vegetarian meals each week.  Eat more home-cooked food and less restaurant, buffet, and fast food.  When eating at a restaurant, ask that your food be prepared with less salt or no salt, if possible. What foods are recommended? The items listed may not be a complete list. Talk with your dietitian about what dietary choices are best for you. Grains Whole-grain or whole-wheat bread. Whole-grain or whole-wheat pasta. Brown rice. [place]. Bulgur. Whole-grain and low-sodium cereals. Pita bread. Low-fat, low-sodium crackers. Whole-wheat flour tortillas. Vegetables Fresh or frozen vegetables (raw, steamed, roasted, or grilled). Low-sodium or reduced-sodium tomato and vegetable juice. Low-sodium or reduced-sodium tomato sauce and tomato paste. Low-sodium or reduced-sodium canned vegetables. Fruits All fresh, dried, or frozen fruit. Canned fruit in natural juice (without added sugar). Meat and other protein foods Skinless chicken or [place]. Ground chicken or [place]. Pork with fat trimmed off. Fish and seafood. Egg whites. Dried beans, peas, or lentils. Unsalted nuts, nut butters, and seeds. Unsalted canned beans. Lean cuts of beef with fat trimmed off. Low-sodium, lean deli meat. Dairy Low-fat (1%) or fat-free (skim) milk. Fat-free, low-fat, or reduced-fat cheeses. Nonfat, low-sodium ricotta or cottage cheese. Low-fat or nonfat yogurt. Low-fat, low-sodium  cheese. Fats and oils Soft margarine without trans fats. Vegetable oil. Low-fat, reduced-fat, or light mayonnaise and salad dressings (reduced-sodium). Canola, safflower, olive, soybean, and sunflower oils. Avocado. Seasoning and other foods Herbs. Spices. Seasoning mixes without salt. Unsalted popcorn and pretzels. Fat-free sweets. What foods are not recommended? The items listed may not be a complete list. Talk with your dietitian about what dietary choices are best for you. Grains Baked goods made with fat, such as croissants, muffins, or some breads. Dry pasta or rice meal packs. Vegetables Creamed or fried vegetables. Vegetables in a cheese sauce. Regular canned vegetables (not low-sodium or reduced-sodium). Regular canned tomato sauce and paste (not low-sodium or reduced-sodium). Regular tomato and vegetable juice (not low-sodium or reduced-sodium). Angie Fava. Olives. Fruits Canned fruit in a light or heavy syrup. Fried fruit. Fruit in cream or butter sauce. Meat and other protein foods Fatty cuts of meat. Ribs. Fried meat. Berniece Salines. Sausage. Bologna and other processed lunch meats. Salami. Fatback. Hotdogs. Bratwurst. Salted  nuts and seeds. Canned beans with added salt. Canned or smoked fish. Whole eggs or egg yolks. Chicken or [place] with skin. Dairy Whole or 2% milk, cream, and half-and-half. Whole or full-fat cream cheese. Whole-fat or sweetened yogurt. Full-fat cheese. Nondairy creamers. Whipped toppings. Processed cheese and cheese spreads. Fats and oils Butter. Stick margarine. Lard. Shortening. Ghee. Bacon fat. Tropical oils, such as coconut, palm kernel, or palm oil. Seasoning and other foods Salted popcorn and pretzels. Onion salt, garlic salt, seasoned salt, table salt, and sea salt. Worcestershire sauce. Tartar sauce. Barbecue sauce. Teriyaki sauce. Soy sauce, including reduced-sodium. Steak sauce. Canned and packaged gravies. Fish sauce. Oyster sauce. Cocktail sauce. Horseradish  that you find on the shelf. Ketchup. Mustard. Meat flavorings and tenderizers. Bouillon cubes. Hot sauce and Tabasco sauce. Premade or packaged marinades. Premade or packaged taco seasonings. Relishes. Regular salad dressings. Where to find more information:  National Heart, Lung, and [place]: https://wilson-eaton.com/  American Heart Association: www.heart.org Summary  The DASH eating plan is a healthy eating plan that has been shown to reduce high blood pressure (hypertension). It may also reduce your risk for type 2 diabetes, heart disease, and stroke.  With the DASH eating plan, you should limit salt (sodium) intake to 2,300 mg a day. If you have hypertension, you may need to reduce your sodium intake to 1,500 mg a day.  When on the DASH eating plan, aim to eat more fresh fruits and vegetables, whole grains, lean proteins, low-fat dairy, and heart-healthy fats.  Work with your health care provider or diet and nutrition specialist (dietitian) to adjust your eating plan to your individual calorie needs. This information is not intended to replace advice given to you by your health care provider. Make sure you discuss any questions you have with your health care provider. Document Released: 11/02/2011 Document Revised: 11/06/2016 Document Reviewed: 11/06/2016 Elsevier Interactive Patient Education  2018 [employer].    IF you received an x-ray today, you will receive an invoice from [facility]. Please contact [facility] at [phone] with questions or concerns regarding your invoice.   IF you received labwork today, you will receive an invoice from [facility]. Please contact [facility] at [phone] with questions or concerns regarding your invoice.   Our billing staff will not be able to assist you with questions regarding bills from these companies.  You will be contacted with the lab results as soon as they are available. The fastest way to get your results is to  activate your My Chart account. Instructions are located on the last page of this paperwork. If you have not heard from [place] regarding the results in 2 weeks, please contact this office.

## 2017-11-16 LAB — CMP14+EGFR
ALBUMIN: 4.1 g/dL (ref 3.6–4.8)
ALT: 25 IU/L (ref 0–44)
AST: 29 IU/L (ref 0–40)
Albumin/Globulin Ratio: 1.8 (ref 1.2–2.2)
Alkaline Phosphatase: 90 IU/L (ref 39–117)
BUN / CREAT RATIO: 12 (ref 10–24)
BUN: 13 mg/dL (ref 8–27)
Bilirubin Total: 0.5 mg/dL (ref 0.0–1.2)
CALCIUM: 9 mg/dL (ref 8.6–10.2)
CO2: 26 mmol/L (ref 20–29)
CREATININE: 1.06 mg/dL (ref 0.76–1.27)
Chloride: 103 mmol/L (ref 96–106)
GFR calc Af Amer: 85 mL/min/{1.73_m2} (ref >59)
GFR, EST NON AFRICAN AMERICAN: 73 mL/min/{1.73_m2} (ref >59)
GLOBULIN, TOTAL: 2.3 g/dL (ref 1.5–4.5)
Glucose: 87 mg/dL (ref 65–99)
Potassium: 4.4 mmol/L (ref 3.5–5.2)
Sodium: 141 mmol/L (ref 134–144)
TOTAL PROTEIN: 6.4 g/dL (ref 6.0–8.5)

## 2017-11-21 DIAGNOSIS — M545 Low back pain: Secondary | ICD-10-CM | POA: Diagnosis not present

## 2017-11-21 DIAGNOSIS — M25512 Pain in left shoulder: Secondary | ICD-10-CM | POA: Diagnosis not present

## 2017-11-21 DIAGNOSIS — Z79891 Long term (current) use of opiate analgesic: Secondary | ICD-10-CM | POA: Diagnosis not present

## 2017-11-21 DIAGNOSIS — G8929 Other chronic pain: Secondary | ICD-10-CM | POA: Diagnosis not present

## 2017-11-21 DIAGNOSIS — G894 Chronic pain syndrome: Secondary | ICD-10-CM | POA: Diagnosis not present

## 2017-11-21 DIAGNOSIS — M25562 Pain in left knee: Secondary | ICD-10-CM | POA: Diagnosis not present

## 2017-11-29 ENCOUNTER — Ambulatory Visit: Payer: Medicare Other | Admitting: Physician Assistant

## 2017-11-29 ENCOUNTER — Encounter: Payer: Self-pay | Admitting: Radiology

## 2017-12-10 DIAGNOSIS — M25512 Pain in left shoulder: Secondary | ICD-10-CM | POA: Diagnosis not present

## 2018-01-10 DIAGNOSIS — R972 Elevated prostate specific antigen [PSA]: Secondary | ICD-10-CM | POA: Diagnosis not present

## 2018-01-28 DIAGNOSIS — D3502 Benign neoplasm of left adrenal gland: Secondary | ICD-10-CM | POA: Diagnosis not present

## 2018-01-28 DIAGNOSIS — R972 Elevated prostate specific antigen [PSA]: Secondary | ICD-10-CM | POA: Diagnosis not present

## 2018-01-28 DIAGNOSIS — N401 Enlarged prostate with lower urinary tract symptoms: Secondary | ICD-10-CM | POA: Diagnosis not present

## 2018-01-28 DIAGNOSIS — R350 Frequency of micturition: Secondary | ICD-10-CM | POA: Diagnosis not present

## 2018-02-04 DIAGNOSIS — D3502 Benign neoplasm of left adrenal gland: Secondary | ICD-10-CM | POA: Diagnosis not present

## 2018-02-04 DIAGNOSIS — K5730 Diverticulosis of large intestine without perforation or abscess without bleeding: Secondary | ICD-10-CM | POA: Diagnosis not present

## 2018-02-25 ENCOUNTER — Encounter: Payer: Self-pay | Admitting: Physician Assistant

## 2018-05-31 DIAGNOSIS — R350 Frequency of micturition: Secondary | ICD-10-CM | POA: Diagnosis not present

## 2018-05-31 DIAGNOSIS — D3502 Benign neoplasm of left adrenal gland: Secondary | ICD-10-CM | POA: Diagnosis not present

## 2018-07-15 DIAGNOSIS — M25512 Pain in left shoulder: Secondary | ICD-10-CM | POA: Diagnosis not present

## 2018-08-28 DIAGNOSIS — M545 Low back pain: Secondary | ICD-10-CM | POA: Diagnosis not present

## 2018-09-13 DIAGNOSIS — M25512 Pain in left shoulder: Secondary | ICD-10-CM | POA: Diagnosis not present

## 2018-10-14 DIAGNOSIS — M25512 Pain in left shoulder: Secondary | ICD-10-CM | POA: Diagnosis not present

## 2018-11-15 DIAGNOSIS — M25511 Pain in right shoulder: Secondary | ICD-10-CM | POA: Diagnosis not present

## 2018-12-10 DIAGNOSIS — R3912 Poor urinary stream: Secondary | ICD-10-CM | POA: Diagnosis not present

## 2018-12-10 DIAGNOSIS — R3911 Hesitancy of micturition: Secondary | ICD-10-CM | POA: Diagnosis not present

## 2018-12-30 DIAGNOSIS — M545 Low back pain: Secondary | ICD-10-CM | POA: Diagnosis not present

## 2019-01-08 DIAGNOSIS — R3911 Hesitancy of micturition: Secondary | ICD-10-CM | POA: Diagnosis not present

## 2019-02-12 DIAGNOSIS — M25512 Pain in left shoulder: Secondary | ICD-10-CM | POA: Diagnosis not present

## 2020-02-20 DIAGNOSIS — R3911 Hesitancy of micturition: Secondary | ICD-10-CM | POA: Diagnosis not present

## 2020-02-26 DIAGNOSIS — R3911 Hesitancy of micturition: Secondary | ICD-10-CM | POA: Diagnosis not present

## 2020-02-26 DIAGNOSIS — R310 Gross hematuria: Secondary | ICD-10-CM | POA: Diagnosis not present

## 2020-03-01 DIAGNOSIS — R310 Gross hematuria: Secondary | ICD-10-CM | POA: Diagnosis not present

## 2020-03-01 DIAGNOSIS — D3501 Benign neoplasm of right adrenal gland: Secondary | ICD-10-CM | POA: Diagnosis not present

## 2021-01-10 ENCOUNTER — Other Ambulatory Visit: Payer: Self-pay | Admitting: Anesthesiology

## 2021-01-10 DIAGNOSIS — G540 Brachial plexus disorders: Secondary | ICD-10-CM

## 2021-01-10 DIAGNOSIS — M5030 Other cervical disc degeneration, unspecified cervical region: Secondary | ICD-10-CM

## 2021-02-02 ENCOUNTER — Other Ambulatory Visit: Payer: Self-pay | Admitting: Anesthesiology

## 2021-02-02 DIAGNOSIS — M5030 Other cervical disc degeneration, unspecified cervical region: Secondary | ICD-10-CM

## 2021-02-02 DIAGNOSIS — G540 Brachial plexus disorders: Secondary | ICD-10-CM

## 2021-02-19 ENCOUNTER — Ambulatory Visit
Admission: RE | Admit: 2021-02-19 | Discharge: 2021-02-19 | Disposition: A | Discharge status: Home / Self Care | Payer: Medicare Other | Source: Ambulatory Visit | Attending: Anesthesiology | Admitting: Anesthesiology

## 2021-02-19 ENCOUNTER — Other Ambulatory Visit: Payer: Self-pay

## 2021-02-19 DIAGNOSIS — M5030 Other cervical disc degeneration, unspecified cervical region: Secondary | ICD-10-CM

## 2021-02-19 DIAGNOSIS — G540 Brachial plexus disorders: Secondary | ICD-10-CM

## 2021-04-15 ENCOUNTER — Other Ambulatory Visit: Payer: Self-pay | Admitting: Family Medicine

## 2021-04-15 ENCOUNTER — Ambulatory Visit
Admission: RE | Admit: 2021-04-15 | Discharge: 2021-04-15 | Disposition: A | Discharge status: Home / Self Care | Payer: Medicare Other | Source: Ambulatory Visit | Attending: Family Medicine | Admitting: Family Medicine

## 2021-04-15 DIAGNOSIS — M170 Bilateral primary osteoarthritis of knee: Secondary | ICD-10-CM

## 2021-04-15 DIAGNOSIS — M172 Bilateral post-traumatic osteoarthritis of knee: Secondary | ICD-10-CM

## 2021-07-05 ENCOUNTER — Other Ambulatory Visit: Payer: Self-pay | Admitting: Adult Health

## 2021-07-05 DIAGNOSIS — R972 Elevated prostate specific antigen [PSA]: Secondary | ICD-10-CM

## 2021-07-20 ENCOUNTER — Ambulatory Visit
Admission: RE | Admit: 2021-07-20 | Discharge: 2021-07-20 | Disposition: A | Discharge status: Home / Self Care | Payer: Medicare Other | Source: Ambulatory Visit | Attending: Adult Health | Admitting: Adult Health

## 2021-07-20 DIAGNOSIS — R972 Elevated prostate specific antigen [PSA]: Secondary | ICD-10-CM

## 2021-07-20 MED ORDER — GADOBENATE DIMEGLUMINE 529 MG/ML IV SOLN
14.0000 mL | Freq: Once | INTRAVENOUS | Status: AC | PRN
  Administered 2021-07-20: 14 mL via INTRAVENOUS

## 2021-10-12 ENCOUNTER — Encounter: Payer: Self-pay | Admitting: Family

## 2021-10-12 ENCOUNTER — Ambulatory Visit: Payer: Medicare Other | Admitting: Family

## 2021-11-09 ENCOUNTER — Ambulatory Visit (HOSPITAL_BASED_OUTPATIENT_CLINIC_OR_DEPARTMENT_OTHER): Payer: Medicare Other | Admitting: Nurse Practitioner

## 2021-11-17 ENCOUNTER — Encounter (HOSPITAL_BASED_OUTPATIENT_CLINIC_OR_DEPARTMENT_OTHER): Payer: Self-pay | Admitting: Nurse Practitioner

## 2021-12-26 ENCOUNTER — Ambulatory Visit: Payer: Medicare Other | Admitting: Nurse Practitioner

## 2022-04-19 ENCOUNTER — Encounter: Payer: Self-pay | Admitting: Surgery

## 2022-08-21 ENCOUNTER — Ambulatory Visit: Payer: Medicare Other | Admitting: Critical Care Medicine

## 2022-09-05 ENCOUNTER — Encounter: Payer: Self-pay | Admitting: Family Medicine

## 2022-09-05 ENCOUNTER — Ambulatory Visit (INDEPENDENT_AMBULATORY_CARE_PROVIDER_SITE_OTHER): Payer: Medicare Other | Admitting: Family Medicine

## 2022-09-05 VITALS — BP 144/80 | HR 55 | Temp 97.8°F | Ht 66.0 in | Wt 147.0 lb

## 2022-09-05 DIAGNOSIS — K641 Second degree hemorrhoids: Secondary | ICD-10-CM | POA: Diagnosis not present

## 2022-09-05 DIAGNOSIS — M5442 Lumbago with sciatica, left side: Secondary | ICD-10-CM | POA: Diagnosis not present

## 2022-09-05 DIAGNOSIS — Z860100 Personal history of colon polyps, unspecified: Secondary | ICD-10-CM | POA: Insufficient documentation

## 2022-09-05 DIAGNOSIS — Z125 Encounter for screening for malignant neoplasm of prostate: Secondary | ICD-10-CM

## 2022-09-05 DIAGNOSIS — M25561 Pain in right knee: Secondary | ICD-10-CM

## 2022-09-05 DIAGNOSIS — Z23 Encounter for immunization: Secondary | ICD-10-CM

## 2022-09-05 DIAGNOSIS — Z1211 Encounter for screening for malignant neoplasm of colon: Secondary | ICD-10-CM

## 2022-09-05 DIAGNOSIS — Z131 Encounter for screening for diabetes mellitus: Secondary | ICD-10-CM | POA: Diagnosis not present

## 2022-09-05 DIAGNOSIS — K649 Unspecified hemorrhoids: Secondary | ICD-10-CM | POA: Insufficient documentation

## 2022-09-05 DIAGNOSIS — N400 Enlarged prostate without lower urinary tract symptoms: Secondary | ICD-10-CM | POA: Diagnosis not present

## 2022-09-05 DIAGNOSIS — M25562 Pain in left knee: Secondary | ICD-10-CM

## 2022-09-05 DIAGNOSIS — B353 Tinea pedis: Secondary | ICD-10-CM

## 2022-09-05 DIAGNOSIS — G8929 Other chronic pain: Secondary | ICD-10-CM

## 2022-09-05 DIAGNOSIS — Z13220 Encounter for screening for lipoid disorders: Secondary | ICD-10-CM | POA: Diagnosis not present

## 2022-09-05 DIAGNOSIS — Z1159 Encounter for screening for other viral diseases: Secondary | ICD-10-CM

## 2022-09-05 DIAGNOSIS — L84 Corns and callosities: Secondary | ICD-10-CM

## 2022-09-05 MED ORDER — HYDROCORTISONE ACETATE 25 MG RE SUPP: 25.0000 mg | Freq: Two times a day (BID) | RECTAL | 1 refills | Status: DC

## 2022-09-05 NOTE — Patient Instructions (Signed)
See your pharmacist about getting your shingles and tetanus vaccines

## 2022-09-05 NOTE — Progress Notes (Signed)
[facility] [address] [place] [address] Dept: [phone] Dept Fax: [phone]  New Patient Office Visit  Subjective:    Patient ID: Ronald Davenport, male    DOB: May 17, 1951, 71 y.o..   MRN: [id number]  Chief Complaint  Patient presents with   Establish Care    NP-establish care.  C/o low back pain that radiates into RT leg.  Has taken Ibuprofen 800 mg with some relief.  ? Hemorrhoid.    History of Present Illness:  Patient is in today to establish care. Mr. Mcmahill was born in [place], [place] in [place]. He is fromt he [place]. He moved to the [place] in the late 1980s to attend college at [employer] in finance. He was working for [employer] as a Pharmacist, community prior to becoming disabled with lower back degenerative disc disease in 2004. He has been married for 30+ years. He has three children. One lives in [place], the other two are back in [place]. 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 He denies use of tobacco, alcohol, or drugs.  Mr. Vassar has a history of chronic low back pain secondary to degenerative disc disease. He notes he has been followed by orthopedics and has had periodic epidural steroid injections.  Mr. Havlin has chronic pain in both of his knees. He has received periodic injections of his knees from Dr. Joyice Faster (anesthesiology)  Mr. Gaffey has a history of hemorrhoids. These have intermittently flared up over a number of years. he has been seen by surgery in the past, who felt these were too small for surgical intervention. He has used OTC hemorrhoidal creams, but feels sometimes these don't help.  Mr. Stonehocker notes he has some pain in both feet associated with thick calluses he has.  Mr. Godley has a history of BPH and an elevated PSA. He is followed by urology. He is currently manage don finasteride 5 mg  daily.  Past Medical History: Patient Active Problem List   Diagnosis Date Noted   Bilateral knee pain 09/05/2022   Hemorrhoids 09/05/2022   History of colon polyps 09/05/2022   Chronic left-sided low back pain with left-sided sciatica 01/05/2017   Adrenal adenoma 11/16/2015   GERD (gastroesophageal reflux disease) 11/28/2014   Chronic back pain 11/27/2014   BPH (benign prostatic hyperplasia) 09/07/2014   Elevated PSA 02/12/2013   Past Surgical History:  Procedure Laterality Date   CATARACT EXTRACTION Left 11/27/2004   PROSTATE BIOPSY     negative in 2011   ROTATOR CUFF REPAIR Right 11/27/2004   Family History  Problem Relation Age of Onset   Stroke Mother    Diabetes Sister    Colon cancer Brother    Heart disease Brother    Outpatient Medications Prior to Visit  Medication Sig Dispense Refill   finasteride (PROSCAR) 5 MG tablet Take 5 mg by mouth daily. Reported on 11/16/2015     ibuprofen (ADVIL,MOTRIN) 800 MG tablet Take 1 tablet (800 mg total) by mouth every 8 (eight) hours as needed. 30 tablet 0   diclofenac (VOLTAREN) 75 MG EC tablet Take 1 tablet twice daily at breakfast and supper for pain and inflammation in back (Patient not taking: Reported on 11/15/2017) 30 tablet 1   hydrochlorothiazide (HYDRODIURIL) 25 MG tablet Take 1 tablet (25 mg total) by mouth daily. 90 tablet 0  methocarbamol (ROBAXIN) 500 MG tablet Take 1 tablet (500 mg) 4 times daily as needed for muscle relaxant (Patient not taking: Reported on 11/15/2017) 40 tablet 1   No facility-administered medications prior to visit.   No Known Allergies    Objective:   Today's Vitals   09/05/22 1549  BP: (!) 144/80  Pulse: (!) 55  Temp: 97.8 F (36.6 C)  TempSrc: Temporal  SpO2: 97%  Weight: 147 lb (66.7 kg)  Height: '5\' 6"'$  (1.676 m)   Body mass index is 23.73 kg/m.   General: Well developed, well nourished. No acute distress. Rectum: Two small hemorrhoidal tags noted. No active  inflammation. Feet- There are several areas of thick, hard callus along the margins of both feet. There is a typical   porokeratosis int he middle of the left foot. There is macerationa nd a thick callus between the right   4th and 5th toes. Psych: Alert and oriented. Normal mood and affect.  Health Maintenance Due  Topic Date Due   Hepatitis C Screening  Never done   TETANUS/TDAP  Never done   Zoster Vaccines- Shingrix (1 of 2) Never done   COLONOSCOPY (Pts 45-106yr Insurance coverage will need to be confirmed)  10/29/2016   Pneumonia Vaccine 70 Years old (1 - PCV) Never done   COVID-19 Vaccine (4 - Pfizer series) 03/26/2021     Assessment & Plan:   1. Chronic left-sided low back pain with left-sided sciatica Follows with orthopedics.  2. Benign prostatic hyperplasia, unspecified whether lower urinary tract symptoms present Has been followed by urology. Currently managed on finasteride. I will reassess his PSA.  - PSA  3. Chronic pain of both knees Apparently secondary to chronic osteoarthritis. Managing in a pain management clinic with periodic injections.  4. Grade II hemorrhoids Agree that there is not current need for surgical intervention. I will fills oem suppositories for his use.  - hydrocortisone (ANUSOL-HC) 25 MG suppository; Place 1 suppository (25 mg total) rectally 2 (two) times daily.  Dispense: 12 suppository; Refill: 1  5. Screening for colon cancer  - Ambulatory referral to Gastroenterology  6. Screening for prostate cancer  - PSA  7. Screening for diabetes mellitus (DM)  - Glucose, random - Hemoglobin A1c  8. Encounter for hepatitis C screening test for low risk patient  - HCV Ab w Reflex to Quant PCR  9. Screening for lipid disorders  - Lipid panel  10. Callus of foot 11. Tinea pedis of right foot I will refer to podiatry for evaluation and treatment of the calluses. He may have some lingering tinea, but uncertain with the callus present in  the same area.  - Ambulatory referral to Podiatry   Return in about 4 weeks (around 10/03/2022) for Reassessment.   SHaydee Salter MD

## 2022-09-06 ENCOUNTER — Encounter: Payer: Self-pay | Admitting: Family Medicine

## 2022-09-06 DIAGNOSIS — R7303 Prediabetes: Secondary | ICD-10-CM | POA: Insufficient documentation

## 2022-09-06 LAB — LIPID PANEL
Cholesterol: 147 mg/dL (ref 0–200)
HDL: 68.9 mg/dL (ref >39.00)
LDL Cholesterol: 68 mg/dL (ref 0–99)
NonHDL: 77.88
Total CHOL/HDL Ratio: 2
Triglycerides: 48 mg/dL (ref 0.0–149.0)
VLDL: 9.6 mg/dL (ref 0.0–40.0)

## 2022-09-06 LAB — HEMOGLOBIN A1C: Hgb A1c MFr Bld: 6.2 % (ref 4.6–6.5)

## 2022-09-06 LAB — PSA: PSA: 4.05 ng/mL — ABNORMAL HIGH (ref 0.10–4.00)

## 2022-09-06 LAB — GLUCOSE, RANDOM: Glucose, Bld: 86 mg/dL (ref 70–99)

## 2022-09-07 LAB — HCV INTERPRETATION

## 2022-09-07 LAB — HCV AB W REFLEX TO QUANT PCR: HCV Ab: NONREACTIVE

## 2022-09-19 ENCOUNTER — Encounter: Payer: Self-pay | Admitting: Podiatry

## 2022-09-19 ENCOUNTER — Ambulatory Visit: Payer: Medicare Other | Admitting: Podiatry

## 2022-09-19 VITALS — BP 176/89 | HR 63

## 2022-09-19 DIAGNOSIS — M79671 Pain in right foot: Secondary | ICD-10-CM

## 2022-09-19 DIAGNOSIS — M79672 Pain in left foot: Secondary | ICD-10-CM

## 2022-09-19 DIAGNOSIS — L84 Corns and callosities: Secondary | ICD-10-CM

## 2022-09-19 NOTE — Patient Instructions (Signed)
Look for urea 92% + 2% salicylic cream or ointment and apply to the thickened dry skin / calluses. This can be bought over the counter, at a pharmacy or online such as [employer].

## 2022-09-20 ENCOUNTER — Encounter: Payer: Self-pay | Admitting: Podiatry

## 2022-09-20 NOTE — Progress Notes (Signed)
  Subjective:  Patient ID: Ronald Davenport, male    DOB: 04-05-1951,  MRN: [id number]  Chief Complaint  Patient presents with   Callouses    Plantar bilateral/interdigital space 4/5 right - callused area x years, tender walking and when 4th and 5th toes rub makes it sore, did try OTC callus cream and used between the toes   New Patient (Initial Visit)    ABN signed for calluses    71 y.o. male presents with the above complaint. History confirmed with patient.   Objective:  Physical Exam: warm, good capillary refill, no trophic changes or ulcerative lesions, normal DP and PT pulses, normal sensory exam, and multiple porokeratotic lesions, submetatarsal 5, submetatarsal 2, fourth interspace heloma molle on the right, plantar fourth MTPJ on the right  Assessment:   1. Callus of foot   2. Pain in both feet      Plan:  Patient was evaluated and treated and all questions answered.  We discussed etiology treatment options of porokeratosis.  We discussed use of urea cream and salicylic acid.  Also discussed debridement of these lesions today.  Discussed that due to Medicare guidelines this is not a covered benefit for him.  We discussed this and he did sign ABN, on further discussion he declined this service and so this was not done today.  Instead he will proceed with pedicures to alleviate the lesions.  He will get salicylic acid and urea cream and return as needed if he needs debridement  No follow-ups on file.

## 2022-09-26 ENCOUNTER — Ambulatory Visit: Payer: Medicare Other | Admitting: Podiatry

## 2022-10-02 ENCOUNTER — Encounter: Payer: Self-pay | Admitting: Family Medicine

## 2022-10-02 ENCOUNTER — Ambulatory Visit (INDEPENDENT_AMBULATORY_CARE_PROVIDER_SITE_OTHER): Payer: Medicare Other | Admitting: Family Medicine

## 2022-10-02 VITALS — BP 134/76 | HR 62 | Temp 97.9°F | Ht 66.0 in | Wt 144.4 lb

## 2022-10-02 DIAGNOSIS — M6530 Trigger finger, unspecified finger: Secondary | ICD-10-CM | POA: Diagnosis not present

## 2022-10-02 DIAGNOSIS — M7541 Impingement syndrome of right shoulder: Secondary | ICD-10-CM

## 2022-10-02 DIAGNOSIS — K641 Second degree hemorrhoids: Secondary | ICD-10-CM | POA: Diagnosis not present

## 2022-10-02 DIAGNOSIS — N400 Enlarged prostate without lower urinary tract symptoms: Secondary | ICD-10-CM

## 2022-10-02 DIAGNOSIS — R972 Elevated prostate specific antigen [PSA]: Secondary | ICD-10-CM | POA: Diagnosis not present

## 2022-10-02 NOTE — Progress Notes (Signed)
[facility] [address] [place] [address] Dept: [phone] Dept Fax: [phone]  Chronic Care Office Visit  Subjective:    Patient ID: Ronald Davenport, male    DOB: 14-Nov-1951, 71 y.o..   MRN: [id number]  Chief Complaint  Patient presents with   Follow-up    4 week f/u.  C/o PSA results and abdominal pain.      History of Present Illness:  Patient is in today for reassessment of chronic medical issues.  Mr. Deschene has a history of hemorrhoids. At his last visit, I had referred him to GI for a colonoscopy. He is concerned about the hemorrhoids possibly giving him trouble. he notes the colonoscopy won't happen until late Dec. He asks about having his hemorrhoids checked.  Mr. Frix has a history of BPH and an elevated PSA. He is followed by urology. He is currently managed on finasteride 5 mg daily. He was concerned that his recent PSA was back up again. He notes he is traveling out of the country soon and is worried that he would not find out soon enough if he had prostate cancer.  Mr. Milstein notes that he has had some recent triggering of his left 3rd and 4th fingers. He states initially this was fairly uncommon. Now He is noting this is happening more frequently.  Mr. Almquist has a history of having had a right rotator cuff repair in 2006. He notes he is getting progressive pain, grinding, and weakness in his right shoulder again.   Past Medical History: Patient Active Problem List   Diagnosis Date Noted   Prediabetes 09/06/2022   Bilateral knee pain 09/05/2022   Hemorrhoids 09/05/2022   History of colon polyps 09/05/2022   Chronic left-sided low back pain with left-sided sciatica 01/05/2017   Adrenal adenoma 11/16/2015   GERD (gastroesophageal reflux disease) 11/28/2014   Chronic back pain 11/27/2014   BPH (benign prostatic hyperplasia) 09/07/2014   Elevated PSA 02/12/2013   Past Surgical History:  Procedure  Laterality Date   CATARACT EXTRACTION Left 11/27/2004   PROSTATE BIOPSY     negative in 2011   ROTATOR CUFF REPAIR Right 11/27/2004   Family History  Problem Relation Age of Onset   Stroke Mother    Diabetes Sister    Colon cancer Brother    Heart disease Brother    Outpatient Medications Prior to Visit  Medication Sig Dispense Refill   finasteride (PROSCAR) 5 MG tablet Take 5 mg by mouth daily. Reported on 11/16/2015     hydrocortisone (ANUSOL-HC) 25 MG suppository Place 1 suppository (25 mg total) rectally 2 (two) times daily. 12 suppository 1   ibuprofen (ADVIL,MOTRIN) 800 MG tablet Take 1 tablet (800 mg total) by mouth every 8 (eight) hours as needed. 30 tablet 0   No facility-administered medications prior to visit.   No Known Allergies    Objective:   Today's Vitals   10/02/22 1527  BP: 134/76  Pulse: 62  Temp: 97.9 F (36.6 C)  TempSrc: Temporal  SpO2: 99%  Weight: 144 lb 6.4 oz (65.5 kg)  Height: '5\' 6"'$  (1.676 m)   Body mass index is 23.31 kg/m.   General: Well developed, well nourished. No acute distress. Extremities: Limited abduction of the right shoulder. No subluxation. Mild weakness in the   right rotator cuff and increased pain with resisted abduction/internal rotation. Left hadn with   normal flexion/extension of the fingers today. No palpable knots in the tendons. Rectal: There  are multiple external hemorrhoids, but none inflamed currently. I cannot   palpate any internal hemorrhoids. The prostate feels symmetrically enlarged and is firm. Psych: Alert and oriented. Normal mood and affect.  Health Maintenance Due  Topic Date Due   Medicare Annual Wellness (AWV)  Never done   TETANUS/TDAP  Never done   Zoster Vaccines- Shingrix (1 of 2) Never done   COLONOSCOPY (Pts 45-40yr Insurance coverage will need to be confirmed)  10/29/2016     Lab Results: Component Ref Range & Units 3 wk ago 7 yr ago 9 yr ago  PSA 0.10 - 4.00 ng/mL 4.05 High  3.15 R, CM  5.27 High    Assessment & Plan:   1. Grade II hemorrhoids No currently inflamed. Recommend we await scheduled colonoscopy.  2. Benign prostatic hyperplasia, unspecified whether lower urinary tract symptoms present I explained that the PSA will likely will not have changed appreciably. However, for his piece of mind, I will repeat the PSA today. - PSA  3. Elevated PSA As above. - PSA  4. Trigger finger of left hand, 3rd and 4th I will refer him to Dr. GAmedeo Plenty(hand surgeon) for assessment and to consider a possible steroid injection for this.  - Ambulatory referral to Orthopedic Surgery  5. Impingement syndrome of right shoulder region Mr. PEssnerhas pain and weakness in his rotator cuff. I will refer him back to orthopedics for assessment.  - Ambulatory referral to Orthopedic Surgery   Return in about 6 months (around 04/02/2023) for Reassessment.   SHaydee Salter MD

## 2022-10-03 ENCOUNTER — Ambulatory Visit (AMBULATORY_SURGERY_CENTER): Payer: Self-pay

## 2022-10-03 VITALS — Ht 68.0 in | Wt 169.0 lb

## 2022-10-03 DIAGNOSIS — Z86010 Personal history of colonic polyps: Secondary | ICD-10-CM

## 2022-10-03 LAB — PSA: PSA: 4.55 ng/mL — ABNORMAL HIGH (ref 0.10–4.00)

## 2022-10-03 MED ORDER — NA SULFATE-K SULFATE-MG SULF 17.5-3.13-1.6 GM/177ML PO SOLN: 1.0000 | Freq: Once | ORAL | 0 refills | Status: AC

## 2022-10-03 NOTE — Progress Notes (Signed)

## 2022-10-16 ENCOUNTER — Telehealth: Payer: Self-pay | Admitting: Family Medicine

## 2022-10-16 NOTE — Telephone Encounter (Signed)
Unable to leave VM due to mailbox is full.  Will try later. Dm/cma

## 2022-10-16 NOTE — Telephone Encounter (Signed)
Lft VM to rtn call. Dm/cma  

## 2022-10-16 NOTE — Telephone Encounter (Signed)
Left message for patient to call back and schedule Medicare Annual Wellness Visit (AWV) in office.  ° °If not able to come in office, please offer to do virtually or by telephone.  Left office number and my jabber #[phone]. ° °AWVI eligible as of 11/27/2009 ° °Please schedule at anytime with Nurse Health Advisor. °  °

## 2022-10-16 NOTE — Telephone Encounter (Signed)
Pt called and stated he did not hear about his results

## 2022-10-16 NOTE — Telephone Encounter (Signed)
Caller Name: Jarion Hawthorne Call back phone #: [phone]  Reason for Call: Please call pt with recent lab results.

## 2022-10-23 NOTE — Telephone Encounter (Signed)
Patient notified VIA phone. Dm/cma  

## 2022-10-24 ENCOUNTER — Ambulatory Visit: Payer: Medicare Other

## 2022-10-31 ENCOUNTER — Encounter: Payer: Medicare Other | Admitting: Internal Medicine

## 2022-10-31 ENCOUNTER — Ambulatory Visit (INDEPENDENT_AMBULATORY_CARE_PROVIDER_SITE_OTHER): Payer: Medicare Other

## 2022-10-31 VITALS — BP 140/70 | HR 59 | Temp 98.0°F | Ht 66.5 in | Wt 149.4 lb

## 2022-10-31 DIAGNOSIS — Z0000 Encounter for general adult medical examination without abnormal findings: Secondary | ICD-10-CM | POA: Diagnosis not present

## 2022-10-31 NOTE — Progress Notes (Signed)
Subjective:   Ronald Davenport is a 71 y.o. male who presents for an Initial Medicare Annual Wellness Visit.  Review of Systems     Cardiac Risk Factors include: advanced age (>55mn, >>33women)     Objective:    Today's Vitals   10/31/22 1430 10/31/22 1434 10/31/22 1458  BP: (!) 160/80  (!) 140/70  Pulse: (!) 59    Temp: 98 F (36.7 C)    TempSrc: Oral    SpO2: 99%    Weight: 149 lb 6.4 oz (67.8 kg)    Height: 5' 6.5" (1.689 m)    PainSc:  5     Body mass index is 23.75 kg/m.     10/31/2022    2:40 PM 12/11/2015    3:28 AM  Advanced Directives  Does Patient Have a Medical Advance Directive? No No  Would patient like information on creating a medical advance directive? No - Patient declined No - patient declined information    Current Medications (verified) Outpatient Encounter Medications as of 10/31/2022  Medication Sig   finasteride (PROSCAR) 5 MG tablet Take 5 mg by mouth daily. Reported on 11/16/2015   hydrocortisone (ANUSOL-HC) 25 MG suppository Place 1 suppository (25 mg total) rectally 2 (two) times daily.   ibuprofen (ADVIL,MOTRIN) 800 MG tablet Take 1 tablet (800 mg total) by mouth every 8 (eight) hours as needed.   No facility-administered encounter medications on file as of 10/31/2022.    Allergies (verified) Patient has no known allergies.   History: Past Medical History:  Diagnosis Date   Adrenal adenoma    left , serial CT scans by urology   Arthritis    BPH (benign prostatic hyperplasia)    with BOO, on finasteride    BPH with elevated PSA    PSA elevated in 2011, prostate bx in 2011 was negative    Headache    Hematospermia    Hematuria    gross and microscopic, followed by [facility], neg cystoscopy   Past Surgical History:  Procedure Laterality Date   CATARACT EXTRACTION Left 11/27/2004   PROSTATE BIOPSY     negative in 2011   [place] 11[place]27[place]2004   Family History  Problem Relation Age of Onset   Stroke  Mother    Diabetes Sister    Colon polyps Brother    Colon cancer Brother    Heart disease Brother    Esophageal cancer Neg Hx    Rectal cancer Neg Hx    Stomach cancer Neg Hx    Social History   Socioeconomic History   Marital status: Married    Spouse name: Not on file   Number of children: 3   Years of education: College    Highest education level: Bachelor's degree (e.g., BA, AB, BS)  Occupational History   Occupation: disability    Comment: DDD lumbar/chronic back pain  Tobacco Use   Smoking status: Never   Smokeless tobacco: Never  Vaping Use   Vaping Use: Never used  Substance and Sexual Activity   Alcohol use: Not Currently    Comment: occasionally - maybe 1-2 times per year.   Drug use: No   Sexual activity: Yes  Other Topics Concern   Not on file  Social History Narrative   [place]   Social Determinants of Health   Financial Resource Strain: Low Risk  (10/31/2022)   Overall Financial Resource Strain (CARDIA)    Difficulty of Paying Living Expenses:  Not hard at all  Food Insecurity: No Food Insecurity (10/31/2022)   Hunger Vital Sign    Worried About Running Out of Food in the Last Year: Never true    Ran Out of Food in the Last Year: Never true  Transportation Needs: No Transportation Needs (10/31/2022)   PRAPARE - Hydrologist (Medical): No    Lack of Transportation (Non-Medical): No  Physical Activity: Inactive (10/31/2022)   Exercise Vital Sign    Days of Exercise per Week: 0 days    Minutes of Exercise per Session: 0 min  Stress: No Stress Concern Present (10/31/2022)   [place]    Feeling of Stress : Not at all  Social Connections: Not on file    Tobacco Counseling Counseling given: Not Answered   Clinical Intake:  Pre-visit preparation completed: Yes  Pain : 0-10 Pain Score: 5  Pain Type: Chronic pain Pain Location:  Back Pain Orientation: Lower Pain Descriptors / Indicators: Aching Pain Onset: More than a month ago Pain Frequency: Intermittent     Nutritional Status: BMI of 19-24  Normal Nutritional Risks: None Diabetes: No  How often do you need to have someone help you when you read instructions, pamphlets, or other written materials from your doctor or pharmacy?: 1 - Never  Diabetic? no  Interpreter Needed?: No  Information entered by :: NAllen LPN   Activities of Daily Living    10/31/2022    2:42 PM  In your present state of health, do you have any difficulty performing the following activities:  Hearing? 0  Vision? 0  Difficulty concentrating or making decisions? 0  Walking or climbing stairs? 0  Dressing or bathing? 0  Doing errands, shopping? 0  Preparing Food and eating ? N  Using the Toilet? N  In the past six months, have you accidently leaked urine? N  Do you have problems with loss of bowel control? N  Managing your Medications? N  Managing your Finances? N  Housekeeping or managing your Housekeeping? N    Patient Care Team: Haydee Salter, MD as PCP - General (Family Medicine) Raynelle Bring, MD as Consulting Physician (Urology) Melida Quitter, MD as Consulting Physician (Otolaryngology) Shanon Ace, MD as Referring Physician (Anesthesiology)  Indicate any recent Medical Services you may have received from other than [facility] providers in the past year (date may be approximate).     Assessment:   This is a routine wellness examination for Ronald Davenport.  Hearing/Vision screen Vision Screening - Comments:: No regular eye exams, Dr. Venetia Maxon  Dietary issues and exercise activities discussed: Current Exercise Habits: The patient does not participate in regular exercise at present   Goals Addressed             This Visit's Progress    Patient Stated       10/31/2022, no goals       Depression Screen    10/31/2022    2:42 PM 09/05/2022    3:47 PM  05/29/2017    2:49 PM 05/15/2017    5:04 PM 02/20/2017    5:32 PM 01/05/2017    5:16 PM 12/27/2016    6:01 PM  PHQ 2/9 Scores  PHQ - 2 Score 0 0 0 0 0 0 0    Fall Risk    10/31/2022    2:42 PM 09/05/2022    3:47 PM 05/29/2017    2:49 PM 05/15/2017    5:04  PM 02/20/2017    5:32 PM  Fall Risk   Falls in the past year? 0 1 No No No  Number falls in past yr: 0 0     Injury with Fall? 0 0     Risk for fall due to : Medication side effect History of fall(s)     Follow up Falls prevention discussed;Education provided;Falls evaluation completed Falls evaluation completed       FALL RISK PREVENTION PERTAINING TO THE HOME:  Any stairs in or around the home? Yes  If so, are there any without handrails? No  Home free of loose throw rugs in walkways, pet beds, electrical cords, etc? Yes  Adequate lighting in your home to reduce risk of falls? Yes   ASSISTIVE DEVICES UTILIZED TO PREVENT FALLS:  Life alert? No  Use of a cane, walker or w/c? No  Grab bars in the bathroom? Yes  Shower chair or bench in shower? Yes  Elevated toilet seat or a handicapped toilet? No   TIMED UP AND GO:  Was the test performed? Yes .  Length of time to ambulate 10 feet: 5 sec.   Gait steady and fast without use of assistive device  Cognitive Function:        10/31/2022    2:46 PM  6CIT Screen  What Year? 0 points  What month? 0 points  What time? 0 points  Count back from 20 0 points  Months in reverse 0 points  Repeat phrase 2 points  Total Score 2 points    Immunizations Immunization History  Administered Date(s) Administered   PFIZER(Purple Top)SARS-COV-2 Vaccination 02/17/2020, 03/09/2020   PNEUMOCOCCAL CONJUGATE-20 09/05/2022   Pfizer Covid-19 Vaccine Bivalent Booster 65yr & up 11/25/2020    TDAP status: Due, Education has been provided regarding the importance of this vaccine. Advised may receive this vaccine at local pharmacy or Health Dept. Aware to provide a copy of the vaccination  record if obtained from local pharmacy or Health Dept. Verbalized acceptance and understanding.  Flu Vaccine status: Declined, Education has been provided regarding the importance of this vaccine but patient still declined. Advised may receive this vaccine at local pharmacy or Health Dept. Aware to provide a copy of the vaccination record if obtained from local pharmacy or Health Dept. Verbalized acceptance and understanding.  Pneumococcal vaccine status: Up to date  Covid-19 vaccine status: Completed vaccines  Qualifies for Shingles Vaccine? Yes   Zostavax completed No   Shingrix Completed?: No.    Education has been provided regarding the importance of this vaccine. Patient has been advised to call insurance company to determine out of pocket expense if they have not yet received this vaccine. Advised may also receive vaccine at local pharmacy or Health Dept. Verbalized acceptance and understanding.  Screening Tests Health Maintenance  Topic Date Due   DTaP/Tdap/Td (1 - Tdap) Never done   Zoster Vaccines- Shingrix (1 of 2) Never done   COLONOSCOPY (Pts 45-464yrInsurance coverage will need to be confirmed)  10/29/2016   COVID-19 Vaccine (4 - 2023-24 season) 07/28/2022   INFLUENZA VACCINE  02/25/2023 (Originally 06/27/2022)   Medicare Annual Wellness (AWV)  11/01/2023   Pneumonia Vaccine 6535Years old  Completed   Hepatitis C Screening  Completed   HPV VACCINES  Aged Out    Health Maintenance  Health Maintenance Due  Topic Date Due   DTaP/Tdap/Td (1 - Tdap) Never done   Zoster Vaccines- Shingrix (1 of 2) Never done   COLONOSCOPY (Pts  45-27yr Insurance coverage will need to be confirmed)  10/29/2016   COVID-19 Vaccine (4 - 2023-24 season) 07/28/2022    Colorectal cancer screening: scheduled for 11/10/2022   Lung Cancer Screening: (Low Dose CT Chest recommended if Age 70-80years, 30 pack-year currently smoking OR have quit w/in 15years.) does not qualify.   Lung Cancer  Screening Referral: no  Additional Screening:  Hepatitis C Screening: does qualify; Completed 09/05/2022  Vision Screening: Recommended annual ophthalmology exams for early detection of glaucoma and other disorders of the eye. Is the patient up to date with their annual eye exam?  No  Who is the provider or what is the name of the office in which the patient attends annual eye exams? Dr. WVenetia MaxonIf pt is not established with a provider, would they like to be referred to a provider to establish care? No .   Dental Screening: Recommended annual dental exams for proper oral hygiene  Community Resource Referral / Chronic Care Management: CRR required this visit?  No   CCM required this visit?  No      Plan:     I have personally reviewed and noted the following in the patient's chart:   Medical and social history Use of alcohol, tobacco or illicit drugs  Current medications and supplements including opioid prescriptions. Patient is not currently taking opioid prescriptions. Functional ability and status Nutritional status Physical activity Advanced directives List of other physicians Hospitalizations, surgeries, and ER visits in previous 12 months Vitals Screenings to include cognitive, depression, and falls Referrals and appointments  In addition, I have reviewed and discussed with patient certain preventive protocols, quality metrics, and best practice recommendations. A written personalized care plan for preventive services as well as general preventive health recommendations were provided to patient.     NKellie Simmering LPN   [id number]  Nurse Notes: none

## 2022-10-31 NOTE — Patient Instructions (Addendum)
[name] , Thank you for taking time to come for your Medicare Wellness Visit. I appreciate your ongoing commitment to your health goals. Please review the following plan we discussed and let me know if I can assist you in the future.   These are the goals we discussed:  Goals      Patient Stated     10/31/2022, no goals        This is a list of the screening recommended for you and due dates:  Health Maintenance  Topic Date Due   DTaP/Tdap/Td vaccine (1 - Tdap) Never done   Zoster (Shingles) Vaccine (1 of 2) Never done   Colon Cancer Screening  10/29/2016   COVID-19 Vaccine (4 - 2023-24 season) 07/28/2022   Flu Shot  02/25/2023*   Medicare Annual Wellness Visit  11/01/2023   Pneumonia Vaccine  Completed   Hepatitis C Screening: USPSTF Recommendation to screen - Ages 18-79 yo.  Completed   HPV Vaccine  Aged Out  *Topic was postponed. The date shown is not the original due date.    Advanced directives: Advance directive discussed with you today. Even though you declined this today please call our office should you change your mind and we can give you the proper paperwork for you to fill out.  Conditions/risks identified: none  Next appointment: Follow up in one year for your annual wellness visit.   Preventive Care 29 Years and Older, Male  Preventive care refers to lifestyle choices and visits with your health care provider that can promote health and wellness. What does preventive care include? A yearly physical exam. This is also called an annual well check. Dental exams once or twice a year. Routine eye exams. Ask your health care provider how often you should have your eyes checked. Personal lifestyle choices, including: Daily care of your teeth and gums. Regular physical activity. Eating a healthy diet. Avoiding tobacco and drug use. Limiting alcohol use. Practicing safe sex. Taking low doses of aspirin every day. Taking vitamin and mineral supplements as  recommended by your health care provider. What happens during an annual well check? The services and screenings done by your health care provider during your annual well check will depend on your age, overall health, lifestyle risk factors, and family history of disease. Counseling  Your health care provider may ask you questions about your: Alcohol use. Tobacco use. Drug use. Emotional well-being. Home and relationship well-being. Sexual activity. Eating habits. History of falls. Memory and ability to understand (cognition). Work and work Statistician. Screening  You may have the following tests or measurements: Height, weight, and BMI. Blood pressure. Lipid and cholesterol levels. These may be checked every 5 years, or more frequently if you are over 58 years old. Skin check. Lung cancer screening. You may have this screening every year starting at age 50 if you have a 30-pack-year history of smoking and currently smoke or have quit within the past 15 years. Fecal occult blood test (FOBT) of the stool. You may have this test every year starting at age 76. Flexible sigmoidoscopy or colonoscopy. You may have a sigmoidoscopy every 5 years or a colonoscopy every 10 years starting at age 71. Prostate cancer screening. Recommendations will vary depending on your family history and other risks. Hepatitis C blood test. Hepatitis B blood test. Sexually transmitted disease (STD) testing. Diabetes screening. This is done by checking your blood sugar (glucose) after you have not eaten for a while (fasting). You may have this  done every 1-3 years. Abdominal aortic aneurysm (AAA) screening. You may need this if you are a current or former smoker. Osteoporosis. You may be screened starting at age 59 if you are at high risk. Talk with your health care provider about your test results, treatment options, and if necessary, the need for more tests. Vaccines  Your health care provider may recommend  certain vaccines, such as: Influenza vaccine. This is recommended every year. Tetanus, diphtheria, and acellular pertussis (Tdap, Td) vaccine. You may need a Td booster every 10 years. Zoster vaccine. You may need this after age 8. Pneumococcal 13-valent conjugate (PCV13) vaccine. One dose is recommended after age 69. Pneumococcal polysaccharide (PPSV23) vaccine. One dose is recommended after age 31. Talk to your health care provider about which screenings and vaccines you need and how often you need them. This information is not intended to replace advice given to you by your health care provider. Make sure you discuss any questions you have with your health care provider. Document Released: 12/10/2015 Document Revised: 08/02/2016 Document Reviewed: 09/14/2015 Elsevier Interactive Patient Education  2017 [place] Prevention in the Home Falls can cause injuries. They can happen to people of all ages. There are many things you can do to make your home safe and to help prevent falls. What can I do on the outside of my home? Regularly fix the edges of walkways and driveways and fix any cracks. Remove anything that might make you trip as you walk through a door, such as a raised step or threshold. Trim any bushes or trees on the path to your home. Use bright outdoor lighting. Clear any walking paths of anything that might make someone trip, such as rocks or tools. Regularly check to see if handrails are loose or broken. Make sure that both sides of any steps have handrails. Any raised decks and porches should have guardrails on the edges. Have any leaves, snow, or ice cleared regularly. Use sand or salt on walking paths during winter. Clean up any spills in your garage right away. This includes oil or grease spills. What can I do in the bathroom? Use night lights. Install grab bars by the toilet and in the tub and shower. Do not use towel bars as grab bars. Use non-skid mats or  decals in the tub or shower. If you need to sit down in the shower, use a plastic, non-slip stool. Keep the floor dry. Clean up any water that spills on the floor as soon as it happens. Remove soap buildup in the tub or shower regularly. Attach bath mats securely with double-sided non-slip rug tape. Do not have throw rugs and other things on the floor that can make you trip. What can I do in the bedroom? Use night lights. Make sure that you have a light by your bed that is easy to reach. Do not use any sheets or blankets that are too big for your bed. They should not hang down onto the floor. Have a firm chair that has side arms. You can use this for support while you get dressed. Do not have throw rugs and other things on the floor that can make you trip. What can I do in the kitchen? Clean up any spills right away. Avoid walking on wet floors. Keep items that you use a lot in easy-to-reach places. If you need to reach something above you, use a strong step stool that has a grab bar. Keep electrical cords out of  the way. Do not use floor polish or wax that makes floors slippery. If you must use wax, use non-skid floor wax. Do not have throw rugs and other things on the floor that can make you trip. What can I do with my stairs? Do not leave any items on the stairs. Make sure that there are handrails on both sides of the stairs and use them. Fix handrails that are broken or loose. Make sure that handrails are as long as the stairways. Check any carpeting to make sure that it is firmly attached to the stairs. Fix any carpet that is loose or worn. Avoid having throw rugs at the top or bottom of the stairs. If you do have throw rugs, attach them to the floor with carpet tape. Make sure that you have a light switch at the top of the stairs and the bottom of the stairs. If you do not have them, ask someone to add them for you. What else can I do to help prevent falls? Wear shoes that: Do not  have high heels. Have rubber bottoms. Are comfortable and fit you well. Are closed at the toe. Do not wear sandals. If you use a stepladder: Make sure that it is fully opened. Do not climb a closed stepladder. Make sure that both sides of the stepladder are locked into place. Ask someone to hold it for you, if possible. Clearly mark and make sure that you can see: Any grab bars or handrails. First and last steps. Where the edge of each step is. Use tools that help you move around (mobility aids) if they are needed. These include: Canes. Walkers. Scooters. Crutches. Turn on the lights when you go into a dark area. Replace any light bulbs as soon as they burn out. Set up your furniture so you have a clear path. Avoid moving your furniture around. If any of your floors are uneven, fix them. If there are any pets around you, be aware of where they are. Review your medicines with your doctor. Some medicines can make you feel dizzy. This can increase your chance of falling. Ask your doctor what other things that you can do to help prevent falls. This information is not intended to replace advice given to you by your health care provider. Make sure you discuss any questions you have with your health care provider. Document Released: 09/09/2009 Document Revised: 04/20/2016 Document Reviewed: 12/18/2014 Elsevier Interactive Patient Education  2017 [employer].

## 2022-11-02 ENCOUNTER — Telehealth: Payer: Self-pay | Admitting: Internal Medicine

## 2022-11-02 NOTE — Telephone Encounter (Signed)
Patient is not sure if he still has his previsit papers and would also like a refresher call if possible. Please advise

## 2022-11-02 NOTE — Telephone Encounter (Signed)
Spoke with pt and he is unsure about anything regarding his colonoscopy on 11-10-22.  PV set up for 11-03-22 at 2:30 pm

## 2022-11-03 ENCOUNTER — Encounter: Payer: Self-pay | Admitting: Internal Medicine

## 2022-11-03 ENCOUNTER — Other Ambulatory Visit: Payer: Self-pay

## 2022-11-03 ENCOUNTER — Ambulatory Visit (AMBULATORY_SURGERY_CENTER): Payer: Medicare Other

## 2022-11-03 VITALS — Ht 68.0 in | Wt 148.0 lb

## 2022-11-03 DIAGNOSIS — Z86010 Personal history of colonic polyps: Secondary | ICD-10-CM

## 2022-11-03 MED ORDER — NA SULFATE-K SULFATE-MG SULF 17.5-3.13-1.6 GM/177ML PO SOLN: 1.0000 | Freq: Once | ORAL | 0 refills | Status: AC

## 2022-11-07 ENCOUNTER — Encounter: Payer: Self-pay | Admitting: Internal Medicine

## 2022-11-08 ENCOUNTER — Telehealth: Payer: Self-pay | Admitting: Internal Medicine

## 2022-11-08 NOTE — Telephone Encounter (Signed)
Patient is calling wondering if it is okay if he takes milk of magnesia. Please advise

## 2022-11-08 NOTE — Telephone Encounter (Signed)
Spoke with patient about prep and milk of magnesia questions.  Instructed pt that he must drink the 2 16oz cups of water after drinking the prep.  Milk of magnesia ok to take if feeling constipated today.

## 2022-11-09 ENCOUNTER — Other Ambulatory Visit: Payer: Self-pay | Admitting: Internal Medicine

## 2022-11-09 MED ORDER — NA SULFATE-K SULFATE-MG SULF 17.5-3.13-1.6 GM/177ML PO SOLN: 1.0000 | Freq: Once | ORAL | 0 refills | Status: DC

## 2022-11-09 MED ORDER — NA SULFATE-K SULFATE-MG SULF 17.5-3.13-1.6 GM/177ML PO SOLN: 1.0000 | Freq: Once | ORAL | 0 refills | Status: AC

## 2022-11-09 NOTE — Telephone Encounter (Signed)
Spoke with pt about injection. States he has pain in his back and shoulder area that started last night. He was checking that if he was able to get a steroid injection today, could he proceed with the procedure tomorrow. RN instructed pt that a steroid injection would not affect the procedure. RN did explain to pt that he will have to lay on his left side for about an hour and it would be up to him if he could tolerate the procedure and prep. Pt to call his Dr to see if he can get an injection today. He will call back to let us know if he plans to go thru with the procedure tomorrow.

## 2022-11-09 NOTE — Telephone Encounter (Signed)
Inbound call from patient requesting a call back to further advise if he should proceed with procedure tomorrow. States he is experiencing back pain and his dr wanted to give him an injection. Please advise.

## 2022-11-09 NOTE — Telephone Encounter (Signed)
Noted Thanks JMP

## 2022-11-09 NOTE — Telephone Encounter (Signed)
Patient called asking for prep to be sent to pharmacy so I did so.  I left him a message that the prescription was sent.

## 2022-11-10 ENCOUNTER — Ambulatory Visit (AMBULATORY_SURGERY_CENTER): Payer: Medicare Other | Admitting: Internal Medicine

## 2022-11-10 ENCOUNTER — Encounter: Payer: Self-pay | Admitting: Internal Medicine

## 2022-11-10 VITALS — BP 151/77 | HR 60 | Temp 97.5°F | Resp 22 | Ht 68.0 in | Wt 148.0 lb

## 2022-11-10 DIAGNOSIS — D123 Benign neoplasm of transverse colon: Secondary | ICD-10-CM

## 2022-11-10 DIAGNOSIS — Z86010 Personal history of colonic polyps: Secondary | ICD-10-CM

## 2022-11-10 DIAGNOSIS — D122 Benign neoplasm of ascending colon: Secondary | ICD-10-CM

## 2022-11-10 DIAGNOSIS — Z800 Family history of malignant neoplasm of digestive organs: Secondary | ICD-10-CM | POA: Diagnosis not present

## 2022-11-10 DIAGNOSIS — Z09 Encounter for follow-up examination after completed treatment for conditions other than malignant neoplasm: Secondary | ICD-10-CM

## 2022-11-10 MED ORDER — SODIUM CHLORIDE 0.9 % IV SOLN: 500.0000 mL | INTRAVENOUS | Status: DC

## 2022-11-10 NOTE — Op Note (Signed)
[facility] Patient Name: Ronald Davenport Procedure Date: 11/10/2022 9:03 AM MRN: [id number] Endoscopist: Jerene Bears , MD, [id number] Age: 71 Referring MD:  Date of Birth: [birth year] Gender: Male Account #: [id number] Procedure:                Colonoscopy Indications:              High risk colon cancer surveillance: Personal                            history of non-advanced adenomas, Last colonoscopy:                            December 2014 (3 adenomas); family hx of colon                            cancer in patient's brother Medicines:                Monitored Anesthesia Care Procedure:                Pre-Anesthesia Assessment:                           - Prior to the procedure, a History and Physical                            was performed, and patient medications and                            allergies were reviewed. The patient's tolerance of                            previous anesthesia was also reviewed. The risks                            and benefits of the procedure and the sedation                            options and risks were discussed with the patient.                            All questions were answered, and informed consent                            was obtained. Prior Anticoagulants: The patient has                            taken no anticoagulant or antiplatelet agents. ASA                            Grade Assessment: II - A patient with mild systemic                            disease. After reviewing the risks and benefits,  the patient was deemed in satisfactory condition to                            undergo the procedure.                           After obtaining informed consent, the colonoscope                            was passed under direct vision. Throughout the                            procedure, the patient's blood pressure, pulse, and                            oxygen saturations were monitored  continuously. The                            Olympus CF-HQ190L [id number]) Colonoscope was                            introduced through the anus and advanced to the                            cecum, identified by appendiceal orifice and                            ileocecal valve. The colonoscopy was performed                            without difficulty. The patient tolerated the                            procedure well. The quality of the bowel                            preparation was good. The ileocecal valve,                            appendiceal orifice, and rectum were photographed. Scope In: 9:13:54 AM Scope Out: 9:32:36 AM Scope Withdrawal Time: 0 hours 15 minutes 30 seconds  Total Procedure Duration: 0 hours 18 minutes 42 seconds  Findings:                 The digital rectal exam was normal.                           Five sessile polyps were found in the ascending                            colon. The polyps were 3 to 6 mm in size. These                            polyps were removed with a cold snare. Resection  and retrieval were complete.                           Two sessile polyps were found in the transverse                            colon. The polyps were 3 to 4 mm in size. These                            polyps were removed with a cold snare. Resection                            and retrieval were complete.                           Multiple large-mouthed, medium-mouthed and                            small-mouthed diverticula were found in the sigmoid                            colon, descending colon and ascending colon.                           The retroflexed view of the distal rectum and anal                            verge was normal and showed no anal or rectal                            abnormalities. Complications:            No immediate complications. Estimated Blood Loss:     Estimated blood loss was minimal. Impression:                - Five 3 to 6 mm polyps in the ascending colon,                            removed with a cold snare. Resected and retrieved.                           - Two 3 to 4 mm polyps in the transverse colon,                            removed with a cold snare. Resected and retrieved.                           - Moderate diverticulosis in the sigmoid colon, in                            the descending colon and in the ascending colon. Recommendation:           - Patient has a contact number available for  emergencies. The signs and symptoms of potential                            delayed complications were discussed with the                            patient. Return to normal activities tomorrow.                            Written discharge instructions were provided to the                            patient.                           - Resume previous diet.                           - Continue present medications.                           - Await pathology results.                           - Repeat colonoscopy is recommended for                            surveillance. The colonoscopy date will be                            determined after pathology results from today's                            exam become available for review. Jerene Bears, MD 11/10/2022 9:35:02 AM This report has been signed electronically.

## 2022-11-10 NOTE — Progress Notes (Signed)
GASTROENTEROLOGY PROCEDURE H&P NOTE   Primary Care Physician: Haydee Salter, MD    Reason for Procedure:  History of adenomatous colon polyps  Plan:    Surveillance colonoscopy  Patient is appropriate for endoscopic procedure(s) in the ambulatory ([facility]) setting.  The nature of the procedure, as well as the risks, benefits, and alternatives were carefully and thoroughly reviewed with the patient. Ample time for discussion and questions allowed. The patient understood, was satisfied, and agreed to proceed.     HPI: Ronald Davenport is a 71 y.o. male who presents for surveillance colonoscopy.  Medical history as below.  Tolerated the prep.  No recent chest pain or shortness of breath.  No abdominal pain today.  Past Medical History:  Diagnosis Date   Adrenal adenoma    left , serial CT scans by urology   Arthritis    BPH (benign prostatic hyperplasia)    with BOO, on finasteride    BPH with elevated PSA    PSA elevated in 2011, prostate bx in 2011 was negative    Cataract    Headache    Hematospermia    Hematuria    gross and microscopic, followed by [facility], neg cystoscopy    Past Surgical History:  Procedure Laterality Date   CATARACT EXTRACTION Left 11/27/2004   PROSTATE BIOPSY     negative in 2011   ROTATOR CUFF REPAIR Right 11/27/2004    Prior to Admission medications   Medication Sig Start Date End Date Taking? Authorizing Provider  finasteride (PROSCAR) 5 MG tablet Take 5 mg by mouth daily. Reported on 11/16/2015   Yes [provider]  hydrocortisone (ANUSOL-HC) 25 MG suppository Place 1 suppository (25 mg total) rectally 2 (two) times daily. 09/05/22   Haydee Salter, MD  ibuprofen (ADVIL,MOTRIN) 800 MG tablet Take 1 tablet (800 mg total) by mouth every 8 (eight) hours as needed. Patient not taking: Reported on 11/03/2022 05/29/17   Joretta Bachelor, PA    Current Outpatient Medications  Medication Sig Dispense Refill   finasteride  (PROSCAR) 5 MG tablet Take 5 mg by mouth daily. Reported on 11/16/2015     hydrocortisone (ANUSOL-HC) 25 MG suppository Place 1 suppository (25 mg total) rectally 2 (two) times daily. 12 suppository 1   ibuprofen (ADVIL,MOTRIN) 800 MG tablet Take 1 tablet (800 mg total) by mouth every 8 (eight) hours as needed. (Patient not taking: Reported on 11/03/2022) 30 tablet 0   Current Facility-Administered Medications  Medication Dose Route Frequency Provider Last Rate Last Admin   0.9 %  sodium chloride infusion  500 mL Intravenous Continuous Carline Dura, Lajuan Lines, MD        Allergies as of 11/10/2022   (No Known Allergies)    Family History  Problem Relation Age of Onset   Stroke Mother    Diabetes Sister    Colon polyps Brother    Colon cancer Brother    Heart disease Brother    Esophageal cancer Neg Hx    Rectal cancer Neg Hx    Stomach cancer Neg Hx     Social History   Socioeconomic History   Marital status: Married    Spouse name: Not on file   Number of children: 3   Years of education: College    Highest education level: Bachelor's degree (e.g., BA, AB, BS)  Occupational History   Occupation: disability    Comment: DDD lumbar/chronic back pain  Tobacco Use   Smoking status: Never  Smokeless tobacco: Never  Vaping Use   Vaping Use: Never used  Substance and Sexual Activity   Alcohol use: Not Currently    Comment: occasionally - maybe 1-2 times per year.   Drug use: No   Sexual activity: Yes  Other Topics Concern   Not on file  Social History Narrative   From [place]; [place] since 1980s.   Social Determinants of Health   Financial Resource Strain: Low Risk  (10/31/2022)   Overall Financial Resource Strain (CARDIA)    Difficulty of Paying Living Expenses: Not hard at all  Food Insecurity: No Food Insecurity (10/31/2022)   Hunger Vital Sign    Worried About Running Out of Food in the Last Year: Never true    Ran Out of Food in the Last Year: Never true  Transportation Needs:  No Transportation Needs (10/31/2022)   PRAPARE - Hydrologist (Medical): No    Lack of Transportation (Non-Medical): No  Physical Activity: Inactive (10/31/2022)   Exercise Vital Sign    Days of Exercise per Week: 0 days    Minutes of Exercise per Session: 0 min  Stress: No Stress Concern Present (10/31/2022)   [place]    Feeling of Stress : Not at all  Social Connections: Not on file  Intimate Partner Violence: Not on file    Physical Exam: Vital signs in last 24 hours: '@BP'$  (!) 176/89 (BP Location: Left Arm, Patient Position: Sitting)   Pulse 64   Temp (!) 97.5 F (36.4 C) (Temporal)   Ht '5\' 8"'$  (1.727 m)   Wt 148 lb (67.1 kg)   SpO2 99%   BMI 22.50 kg/m  GEN: NAD EYE: Sclerae anicteric ENT: MMM CV: Non-tachycardic Pulm: CTA b/l GI: Soft, NT/ND NEURO:  Alert & Oriented x 3   Zenovia Jarred, MD [facility]  11/10/2022 9:01 AM

## 2022-11-10 NOTE — Progress Notes (Signed)
Called to room to assist during endoscopic procedure.  Patient ID and intended procedure confirmed with present staff. Received instructions for my participation in the procedure from the performing physician.  

## 2022-11-10 NOTE — Progress Notes (Signed)
Sedate, gd SR, tolerated procedure well, VSS, report to RN 

## 2022-11-10 NOTE — Progress Notes (Signed)
Pt's states no medical or surgical changes since previsit or office visit. 

## 2022-11-10 NOTE — Patient Instructions (Signed)
    Handout on polyps & diverticulosis given to you today   Await pathology results on polyps removed     YOU HAD AN ENDOSCOPIC PROCEDURE TODAY AT [facility]:   Refer to the procedure report that was given to you for any specific questions about what was found during the examination.  If the procedure report does not answer your questions, please call your gastroenterologist to clarify.  If you requested that your care partner not be given the details of your procedure findings, then the procedure report has been included in a sealed envelope for you to review at your convenience later.  YOU SHOULD EXPECT: Some feelings of bloating in the abdomen. Passage of more gas than usual.  Walking can help get rid of the air that was put into your GI tract during the procedure and reduce the bloating. If you had a lower endoscopy (such as a colonoscopy or flexible sigmoidoscopy) you may notice spotting of blood in your stool or on the toilet paper. If you underwent a bowel prep for your procedure, you may not have a normal bowel movement for a few days.  Please Note:  You might notice some irritation and congestion in your nose or some drainage.  This is from the oxygen used during your procedure.  There is no need for concern and it should clear up in a day or so.  SYMPTOMS TO REPORT IMMEDIATELY:  Following lower endoscopy (colonoscopy or flexible sigmoidoscopy):  Excessive amounts of blood in the stool  Significant tenderness or worsening of abdominal pains  Swelling of the abdomen that is new, acute  Fever of 100F or higher  For urgent or emergent issues, a gastroenterologist can be reached at any hour by calling [phone]. Do not use MyChart messaging for urgent concerns.    DIET:  We do recommend a small meal at first, but then you may proceed to your regular diet.  Drink plenty of fluids but you should avoid alcoholic beverages for 24 hours.  ACTIVITY:  You should  plan to take it easy for the rest of today and you should NOT DRIVE or use heavy machinery until tomorrow (because of the sedation medicines used during the test).    FOLLOW UP: Our staff will call the number listed on your records the next business day following your procedure.  We will call around 7:15- 8:00 am to check on you and address any questions or concerns that you may have regarding the information given to you following your procedure. If we do not reach you, we will leave a message.     If any biopsies were taken you will be contacted by phone or by letter within the next 1-3 weeks.  Please call us at [phone] if you have not heard about the biopsies in 3 weeks.    SIGNATURES/CONFIDENTIALITY: You and/or your care partner have signed paperwork which will be entered into your electronic medical record.  These signatures attest to the fact that that the information above on your After Visit Summary has been reviewed and is understood.  Full responsibility of the confidentiality of this discharge information lies with you and/or your care-partner.

## 2022-11-13 ENCOUNTER — Telehealth: Payer: Self-pay | Admitting: *Deleted

## 2022-11-13 NOTE — Telephone Encounter (Signed)
Patient returned the call, stated he is doing well and had no questions at this time.

## 2022-11-13 NOTE — Telephone Encounter (Signed)
Left message on f/u call 

## 2022-11-14 ENCOUNTER — Encounter: Payer: Self-pay | Admitting: Internal Medicine

## 2022-12-06 ENCOUNTER — Ambulatory Visit
Admission: RE | Admit: 2022-12-06 | Discharge: 2022-12-06 | Disposition: A | Discharge status: Home / Self Care | Payer: Medicare Other | Source: Ambulatory Visit | Attending: Anesthesiology | Admitting: Anesthesiology

## 2022-12-06 ENCOUNTER — Other Ambulatory Visit: Payer: Self-pay | Admitting: Anesthesiology

## 2022-12-06 DIAGNOSIS — M51369 Other intervertebral disc degeneration, lumbar region without mention of lumbar back pain or lower extremity pain: Secondary | ICD-10-CM

## 2022-12-06 DIAGNOSIS — M5136 Other intervertebral disc degeneration, lumbar region: Secondary | ICD-10-CM

## 2022-12-06 DIAGNOSIS — M5414 Radiculopathy, thoracic region: Secondary | ICD-10-CM

## 2023-03-02 DIAGNOSIS — M25511 Pain in right shoulder: Secondary | ICD-10-CM | POA: Diagnosis not present

## 2023-03-02 DIAGNOSIS — M25512 Pain in left shoulder: Secondary | ICD-10-CM | POA: Diagnosis not present

## 2023-03-05 DIAGNOSIS — M25512 Pain in left shoulder: Secondary | ICD-10-CM | POA: Diagnosis not present

## 2023-03-05 DIAGNOSIS — M25511 Pain in right shoulder: Secondary | ICD-10-CM | POA: Diagnosis not present

## 2023-03-09 DIAGNOSIS — M25511 Pain in right shoulder: Secondary | ICD-10-CM | POA: Diagnosis not present

## 2023-04-02 ENCOUNTER — Ambulatory Visit: Payer: Medicare Other | Admitting: Family Medicine

## 2023-04-06 ENCOUNTER — Ambulatory Visit: Payer: Medicare Other | Admitting: Family Medicine

## 2023-04-09 ENCOUNTER — Ambulatory Visit: Payer: Medicare Other | Admitting: Family Medicine

## 2023-04-09 ENCOUNTER — Telehealth: Payer: Self-pay | Admitting: Family Medicine

## 2023-04-09 NOTE — Telephone Encounter (Signed)
Pt was a no show for an OV with Dr Rudd on 04/09/23, I sent a no show letter.  

## 2023-04-20 NOTE — Telephone Encounter (Signed)
1st no show, fee waived, letter sent 

## 2023-08-14 DIAGNOSIS — R3121 Asymptomatic microscopic hematuria: Secondary | ICD-10-CM | POA: Diagnosis not present

## 2023-08-16 ENCOUNTER — Other Ambulatory Visit: Payer: Self-pay | Admitting: Anesthesiology

## 2023-08-16 ENCOUNTER — Ambulatory Visit
Admission: RE | Admit: 2023-08-16 | Discharge: 2023-08-16 | Disposition: A | Discharge status: Home / Self Care | Payer: Medicare Other | Source: Ambulatory Visit | Attending: Anesthesiology | Admitting: Anesthesiology

## 2023-08-16 DIAGNOSIS — M5440 Lumbago with sciatica, unspecified side: Secondary | ICD-10-CM

## 2023-08-16 DIAGNOSIS — M549 Dorsalgia, unspecified: Secondary | ICD-10-CM | POA: Diagnosis not present

## 2023-08-29 ENCOUNTER — Telehealth: Payer: Self-pay | Admitting: Family Medicine

## 2023-08-29 ENCOUNTER — Encounter: Payer: Self-pay | Admitting: Nurse Practitioner

## 2023-08-29 ENCOUNTER — Ambulatory Visit: Payer: Medicare Other | Admitting: Nurse Practitioner

## 2023-08-29 VITALS — BP 170/100 | HR 67 | Temp 98.4°F | Resp 18 | Ht 68.0 in | Wt 136.8 lb

## 2023-08-29 DIAGNOSIS — R972 Elevated prostate specific antigen [PSA]: Secondary | ICD-10-CM | POA: Diagnosis not present

## 2023-08-29 DIAGNOSIS — E1165 Type 2 diabetes mellitus with hyperglycemia: Secondary | ICD-10-CM

## 2023-08-29 DIAGNOSIS — R7303 Prediabetes: Secondary | ICD-10-CM

## 2023-08-29 DIAGNOSIS — F430 Acute stress reaction: Secondary | ICD-10-CM | POA: Diagnosis not present

## 2023-08-29 DIAGNOSIS — I10 Essential (primary) hypertension: Secondary | ICD-10-CM | POA: Insufficient documentation

## 2023-08-29 LAB — RENAL FUNCTION PANEL
Albumin: 3.8 g/dL (ref 3.5–5.2)
BUN: 22 mg/dL (ref 6–23)
CO2: 26 meq/L (ref 19–32)
Calcium: 9 mg/dL (ref 8.4–10.5)
Chloride: 105 meq/L (ref 96–112)
Creatinine, Ser: 1.15 mg/dL (ref 0.40–1.50)
GFR: 64.15 mL/min (ref >60.00)
Glucose, Bld: 102 mg/dL — ABNORMAL HIGH (ref 70–99)
Phosphorus: 3.8 mg/dL (ref 2.3–4.6)
Potassium: 4.7 meq/L (ref 3.5–5.1)
Sodium: 138 meq/L (ref 135–145)

## 2023-08-29 LAB — HEMOGLOBIN A1C: Hgb A1c MFr Bld: 6.5 % (ref 4.6–6.5)

## 2023-08-29 LAB — TSH: TSH: 2.13 u[IU]/mL (ref 0.35–5.50)

## 2023-08-29 LAB — PSA: PSA: 4.61 ng/mL — ABNORMAL HIGH (ref 0.10–4.00)

## 2023-08-29 MED ORDER — AMLODIPINE-OLMESARTAN 5-20 MG PO TABS: 1.0000 | ORAL_TABLET | Freq: Every evening | ORAL | 5 refills | Status: DC

## 2023-08-29 NOTE — Assessment & Plan Note (Addendum)
Elevated BP x 1week, with intermittent headache. He thinks this is due to recent stressors and lack of adequate rest. His wife passed away suddenly 3months ago. Her funeral is planned for 09/08/2023 in [place],but he has been in conflict with her family.  Today he denies any headache or CP or dizziness or LE edema or any focal deficit. BP Readings from Last 3 Encounters:  08/29/23 (!) 170/100  11/10/22 (!) 151/77  10/31/22 (!) 140/70    Start azor 5/20mg  1tab daily Advised to maintain DASH diet, provided printed information. Check BMP and THYROID F/up in 1week prior to departure to [place]

## 2023-08-29 NOTE — Telephone Encounter (Signed)
Lft VM to rtn call. Dm/cma  

## 2023-08-29 NOTE — Assessment & Plan Note (Signed)
Repeat hgbA1c 

## 2023-08-29 NOTE — Assessment & Plan Note (Signed)
Nocturia 1-2x/night No hematuria Repeat PSA

## 2023-08-29 NOTE — Telephone Encounter (Signed)
FYI: This call has been transferred to triage nurse: the Triage Nurse. Once the result note has been entered staff can address the message at that time.  Patient called in with the following symptoms:  Red Word:elevated blood pressure, 168/98-99.   Please advise at [facility] [phone]  Message is routed to Provider Pool.

## 2023-08-29 NOTE — Progress Notes (Signed)
Established Patient Visit  Patient: Ronald Davenport   DOB: 1951/06/11   72 y.o. Male  MRN: [id number] Visit Date: 08/29/2023  Subjective:    Chief Complaint  Patient presents with   Hypertension    Pt states he has had high blood pressure for the past 2 days  Slight headache    Hypertension   Primary hypertension Elevated BP x 1week, with intermittent headache. He thinks this is due to recent stressors and lack of adequate rest. His wife passed away suddenly 3months ago. Her funeral is planned for 09/08/2023 in [place],but he has been in conflict with her family.  Today he denies any headache or CP or dizziness or LE edema or any focal deficit. BP Readings from Last 3 Encounters:  08/29/23 (!) 170/100  11/10/22 (!) 151/77  10/31/22 (!) 140/70    Start azor 5/20mg  1tab daily Advised to maintain DASH diet, provided printed information. Check BMP and THYROID F/up in 1week prior to departure to [place]  Elevated PSA Nocturia 1-2x/night No hematuria Repeat PSA  Prediabetes Repeat hgbA1c   Reviewed medical, surgical, and social history today  Medications: Outpatient Medications Prior to Visit  Medication Sig Note   finasteride (PROSCAR) 5 MG tablet Take 5 mg by mouth daily. Reported on 11/16/2015    ibuprofen (ADVIL,MOTRIN) 800 MG tablet Take 1 tablet (800 mg total) by mouth every 8 (eight) hours as needed. 08/29/2023: As needed for back pain    hydrocortisone (ANUSOL-HC) 25 MG suppository Place 1 suppository (25 mg total) rectally 2 (two) times daily. (Patient not taking: Reported on 08/29/2023)    No facility-administered medications prior to visit.   Reviewed past medical and social history.   ROS per HPI above      Objective:  BP (!) 170/100   Pulse 67   Temp 98.4 F (36.9 C) (Temporal)   Resp 18   Ht 5\' 8"  (1.727 m)   Wt 136 lb 12.8 oz (62.1 kg)   SpO2 99%   BMI 20.80 kg/m      Physical Exam Vitals and nursing note reviewed.   Cardiovascular:     Rate and Rhythm: Normal rate and regular rhythm.     Pulses: Normal pulses.     Heart sounds: Normal heart sounds.  Pulmonary:     Effort: Pulmonary effort is normal.     Breath sounds: Normal breath sounds.  Musculoskeletal:     Right lower leg: No edema.     Left lower leg: No edema.  Neurological:     Mental Status: He is alert and oriented to person, place, and time.     Cranial Nerves: No cranial nerve deficit.     No results found for any visits on 08/29/23.    Assessment & Plan:    Problem List Items Addressed This Visit     Elevated PSA    Nocturia 1-2x/night No hematuria Repeat PSA      Relevant Orders   PSA   Prediabetes    Repeat hgbA1c      Relevant Orders   Hemoglobin A1c   Primary hypertension - Primary    Elevated BP x 1week, with intermittent headache. He thinks this is due to recent stressors and lack of adequate rest. His wife passed away suddenly 3months ago. Her funeral is planned for 09/08/2023 in [place],but he has been in conflict with her family.  Today he denies any headache  or CP or dizziness or LE edema or any focal deficit. BP Readings from Last 3 Encounters:  08/29/23 (!) 170/100  11/10/22 (!) 151/77  10/31/22 (!) 140/70    Start azor 5/20mg  1tab daily Advised to maintain DASH diet, provided printed information. Check BMP and THYROID F/up in 1week prior to departure to [place]      Relevant Medications   amLODipine-olmesartan (AZOR) 5-20 MG tablet   Other Relevant Orders   TSH   Renal Function Panel   Other Visit Diagnoses     Stress reaction          Return in about 6 days (around 09/04/2023) for HTN.     Alysia Penna, NP

## 2023-08-29 NOTE — Patient Instructions (Addendum)
Go to lab Maintain a low salt diet and daily exercise. Start Azor 1tab daily  DASH Eating Plan DASH stands for Dietary Approaches to Stop Hypertension. The DASH eating plan is a healthy eating plan that has been shown to: Lower high blood pressure (hypertension). Reduce your risk for type 2 diabetes, heart disease, and stroke. Help with weight loss. What are tips for following this plan? Reading food labels Check food labels for the amount of salt (sodium) per serving. Choose foods with less than 5 percent of the Daily Value (DV) of sodium. In general, foods with less than 300 milligrams (mg) of sodium per serving fit into this eating plan. To find whole grains, look for the word "whole" as the first word in the ingredient list. Shopping Buy products labeled as "low-sodium" or "no salt added." Buy fresh foods. Avoid canned foods and pre-made or frozen meals. Cooking Try not to add salt when you cook. Use salt-free seasonings or herbs instead of table salt or sea salt. Check with your health care provider or pharmacist before using salt substitutes. Do not fry foods. Cook foods in healthy ways, such as baking, boiling, grilling, roasting, or broiling. Cook using oils that are good for your heart. These include olive, canola, avocado, soybean, and sunflower oil. Meal planning  Eat a balanced diet. This should include: 4 or more servings of fruits and 4 or more servings of vegetables each day. Try to fill half of your plate with fruits and vegetables. 6-8 servings of whole grains each day. 6 or less servings of lean meat, poultry, or fish each day. 1 oz is 1 serving. A 3 oz (85 g) serving of meat is about the same size as the palm of your hand. One egg is 1 oz (28 g). 2-3 servings of low-fat dairy each day. One serving is 1 cup (237 mL). 1 serving of nuts, seeds, or beans 5 times each week. 2-3 servings of heart-healthy fats. Healthy fats called omega-3 fatty acids are found in foods such as  walnuts, flaxseeds, fortified milks, and eggs. These fats are also found in cold-water fish, such as sardines, salmon, and mackerel. Limit how much you eat of: Canned or prepackaged foods. Food that is high in trans fat, such as fried foods. Food that is high in saturated fat, such as fatty meat. Desserts and other sweets, sugary drinks, and other foods with added sugar. Full-fat dairy products. Do not salt foods before eating. Do not eat more than 4 egg yolks a week. Try to eat at least 2 vegetarian meals a week. Eat more home-cooked food and less restaurant, buffet, and fast food. Lifestyle When eating at a restaurant, ask if your food can be made with less salt or no salt. If you drink alcohol: Limit how much you have to: 0-1 drink a day if you are male. 0-2 drinks a day if you are male. Know how much alcohol is in your drink. In the [place] one drink is one 12 oz bottle of beer (355 mL), one 5 oz glass of wine (148 mL), or one 1 oz glass of hard liquor (44 mL). General information Avoid eating more than 2,300 mg of salt a day. If you have hypertension, you may need to reduce your sodium intake to 1,500 mg a day. Work with your provider to stay at a healthy body weight or lose weight. Ask what the best weight range is for you. On most days of the week, get at least 30  minutes of exercise that causes your heart to beat faster. This may include walking, swimming, or biking. Work with your provider or dietitian to adjust your eating plan to meet your specific calorie needs. What foods should I eat? Fruits All fresh, dried, or frozen fruit. Canned fruits that are in their natural juice and do not have sugar added to them. Vegetables Fresh or frozen vegetables that are raw, steamed, roasted, or grilled. Low-sodium or reduced-sodium tomato and vegetable juice. Low-sodium or reduced-sodium tomato sauce and tomato paste. Low-sodium or reduced-sodium canned vegetables. Grains Whole-grain or  whole-wheat bread. Whole-grain or whole-wheat pasta. Brown rice. Orpah Cobb. Bulgur. Whole-grain and low-sodium cereals. Pita bread. Low-fat, low-sodium crackers. Whole-wheat flour tortillas. Meats and other proteins Skinless chicken or [place]. Ground chicken or [place]. Pork with fat trimmed off. Fish and seafood. Egg whites. Dried beans, peas, or lentils. Unsalted nuts, nut butters, and seeds. Unsalted canned beans. Lean cuts of beef with fat trimmed off. Low-sodium, lean precooked or cured meat, such as sausages or meat loaves. Dairy Low-fat (1%) or fat-free (skim) milk. Reduced-fat, low-fat, or fat-free cheeses. Nonfat, low-sodium ricotta or cottage cheese. Low-fat or nonfat yogurt. Low-fat, low-sodium cheese. Fats and oils Soft margarine without trans fats. Vegetable oil. Reduced-fat, low-fat, or light mayonnaise and salad dressings (reduced-sodium). Canola, safflower, olive, avocado, soybean, and sunflower oils. Avocado. Seasonings and condiments Herbs. Spices. Seasoning mixes without salt. Other foods Unsalted popcorn and pretzels. Fat-free sweets. The items listed above may not be all the foods and drinks you can have. Talk to a dietitian to learn more. What foods should I avoid? Fruits Canned fruit in a light or heavy syrup. Fried fruit. Fruit in cream or butter sauce. Vegetables Creamed or fried vegetables. Vegetables in a cheese sauce. Regular canned vegetables that are not marked as low-sodium or reduced-sodium. Regular canned tomato sauce and paste that are not marked as low-sodium or reduced-sodium. Regular tomato and vegetable juices that are not marked as low-sodium or reduced-sodium. Rosita Fire. Olives. Grains Baked goods made with fat, such as croissants, muffins, or some breads. Dry pasta or rice meal packs. Meats and other proteins Fatty cuts of meat. Ribs. Fried meat. Tomasa Blase. Bologna, salami, and other precooked or cured meats, such as sausages or meat loaves, that are not  lean and low in sodium. Fat from the back of a pig (fatback). Bratwurst. Salted nuts and seeds. Canned beans with added salt. Canned or smoked fish. Whole eggs or egg yolks. Chicken or [place] with skin. Dairy Whole or 2% milk, cream, and half-and-half. Whole or full-fat cream cheese. Whole-fat or sweetened yogurt. Full-fat cheese. Nondairy creamers. Whipped toppings. Processed cheese and cheese spreads. Fats and oils Butter. Stick margarine. Lard. Shortening. Ghee. Bacon fat. Tropical oils, such as coconut, palm kernel, or palm oil. Seasonings and condiments Onion salt, garlic salt, seasoned salt, table salt, and sea salt. Worcestershire sauce. Tartar sauce. Barbecue sauce. Teriyaki sauce. Soy sauce, including reduced-sodium soy sauce. Steak sauce. Canned and packaged gravies. Fish sauce. Oyster sauce. Cocktail sauce. Store-bought horseradish. Ketchup. Mustard. Meat flavorings and tenderizers. Bouillon cubes. Hot sauces. Pre-made or packaged marinades. Pre-made or packaged taco seasonings. Relishes. Regular salad dressings. Other foods Salted popcorn and pretzels. The items listed above may not be all the foods and drinks you should avoid. Talk to a dietitian to learn more. Where to find more information National Heart, Lung, and Blood Institute (NHLBI): BuffaloDryCleaner.gl American Heart Association (AHA): heart.org Academy of Nutrition and Dietetics: eatright.org National Kidney Foundation (NKF): kidney.org This information  is not intended to replace advice given to you by your health care provider. Make sure you discuss any questions you have with your health care provider. Document Revised: 11/30/2022 Document Reviewed: 11/30/2022 Elsevier Patient Education  2024 [employer].

## 2023-08-30 ENCOUNTER — Telehealth: Payer: Self-pay

## 2023-08-30 MED ORDER — METFORMIN HCL 500 MG PO TABS
250.0000 mg | ORAL_TABLET | Freq: Two times a day (BID) | ORAL | 5 refills | Status: DC
Start: 2023-08-30 — End: 2024-10-07

## 2023-08-30 NOTE — Telephone Encounter (Signed)
A user error has taken place: encounter opened in error, closed for administrative reasons.

## 2023-08-30 NOTE — Telephone Encounter (Signed)
Lft VM to rtn call. Dm/cma  

## 2023-08-30 NOTE — Telephone Encounter (Signed)
Gave him his results and then did his BP twice once in each arm per patient request: LT arm  162/90 RT arm 170/90    Scheduled him an appointment with Dr Veto Kemps at 11:00 am on 09/04/23. Dm/cma

## 2023-08-30 NOTE — Addendum Note (Signed)
Addended by: Alysia Penna L on: 08/30/2023 01:02 PM   Modules accepted: Orders

## 2023-09-03 ENCOUNTER — Ambulatory Visit: Payer: Medicare Other | Admitting: Nurse Practitioner

## 2023-09-03 ENCOUNTER — Ambulatory Visit (INDEPENDENT_AMBULATORY_CARE_PROVIDER_SITE_OTHER): Payer: Medicare Other | Admitting: Family Medicine

## 2023-09-03 ENCOUNTER — Encounter: Payer: Self-pay | Admitting: Family Medicine

## 2023-09-03 VITALS — BP 130/70 | HR 78 | Temp 99.2°F | Ht 68.0 in | Wt 130.0 lb

## 2023-09-03 DIAGNOSIS — I10 Essential (primary) hypertension: Secondary | ICD-10-CM | POA: Diagnosis not present

## 2023-09-03 DIAGNOSIS — H5711 Ocular pain, right eye: Secondary | ICD-10-CM

## 2023-09-03 DIAGNOSIS — R7303 Prediabetes: Secondary | ICD-10-CM

## 2023-09-03 DIAGNOSIS — K641 Second degree hemorrhoids: Secondary | ICD-10-CM | POA: Diagnosis not present

## 2023-09-03 NOTE — Assessment & Plan Note (Signed)
Blood pressure is improved today. Continue amlodipine-olmesartan (Azor) 5-20 mg daily.

## 2023-09-03 NOTE — Assessment & Plan Note (Signed)
The A1c is elevated, but his blood sugar was not at diagnostic levels and he has no significant hyperglycemia symptoms. He does not meet ADA criteria as of yet to establish diabetes without confirmation. He could either take the metformin or hold this for the time being.

## 2023-09-03 NOTE — Assessment & Plan Note (Signed)
Requests refill of his hydrocortisone suppositories.

## 2023-09-03 NOTE — Progress Notes (Signed)
[facility] [address] [place] [address] Dept: [phone] Dept Fax: [phone]  Office Visit  Subjective:    Patient ID: Ronald Davenport, male    DOB: 07-Jun-1951, 72 y.o..   MRN: [id number]  Chief Complaint  Patient presents with   Hypertension    C/o having elevated BP and also having Rt eye pain x 2 days.  Has been using OTC eye drops.    History of Present Illness:  Patient is in today for reassessment of his elevated blood pressures. He was seen last week by Ms. Nche, having had a week-long history of an elevated blood pressure. His blood pressure was at 170/100 at presentation. He had noted increased stressors related to family issues. Mr. Lucus wife had died suddenly her sleep on Mar 29, 2023. Ms. Elease Etienne started him on amlodipine-olmesartan (Azor) 5-20 mg daily. He is tolerating this well.  Mr. Mullens also mentions a few day history of some right eye pain. He notes this esp. at night. He started using an allergy eye drop a few days ago.  Mr. Littles has a history of prediabetes. Ms. Elease Etienne did some testing and started hium on metformin as well.  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/05/2017   Adrenal adenoma 11/16/2015   GERD (gastroesophageal reflux disease) 11/28/2014   Chronic back pain 11/27/2014   BPH (benign prostatic hyperplasia) 09/07/2014   Elevated PSA 02/12/2013   Past Surgical History:  Procedure Laterality Date   CATARACT EXTRACTION Left 11/27/2004   PROSTATE BIOPSY     negative in 2011   ROTATOR CUFF REPAIR Right 11/27/2004   Family History  Problem Relation Age of Onset   Stroke Mother    Diabetes Sister    Colon polyps Brother    Colon cancer  Brother    Heart disease Brother    Esophageal cancer Neg Hx    Rectal cancer Neg Hx    Stomach cancer Neg Hx    Outpatient Medications Prior to Visit  Medication Sig Dispense Refill   amLODipine-olmesartan (AZOR) 5-20 MG tablet Take 1 tablet by mouth every evening. 30 tablet 5   finasteride (PROSCAR) 5 MG tablet Take 5 mg by mouth daily. Reported on 11/16/2015     ibuprofen (ADVIL,MOTRIN) 800 MG tablet Take 1 tablet (800 mg total) by mouth every 8 (eight) hours as needed. 30 tablet 0   metFORMIN (GLUCOPHAGE) 500 MG tablet Take 0.5 tablets (250 mg total) by mouth 2 (two) times daily with a meal. 30 tablet 5   hydrocortisone (ANUSOL-HC) 25 MG suppository Place 1 suppository (25 mg total) rectally 2 (two) times daily. (Patient not taking: Reported on 08/29/2023) 12 suppository 1   No facility-administered medications prior to visit.   No Known Allergies   Objective:   Today's Vitals   09/03/23 1105  BP: 130/70  Pulse: 78  Temp: 99.2 F (37.3 C)  TempSrc: Temporal  SpO2: 100%  Weight: 130 lb (59 kg)  Height: 5\' 8"  (1.727 m)   Body mass index is 19.77 kg/m.   General: Well developed, well nourished. No acute distress. HEENT: Normocephalic, non-traumatic. PERRL, EOMI. The sclera is muddy, esp. medially in the right eye.   Conjunctiva clear.  Psych: Alert and oriented. Normal  mood and affect.  Health Maintenance Due  Topic Date Due   Diabetic kidney evaluation - Urine ACR  Never done   DTaP/Tdap/Td (1 - Tdap) Never done   Zoster Vaccines- Shingrix (1 of 2) Never done   Medicare Annual Wellness (AWV)  11/01/2023     Lab Results:    Latest Ref Rng & Units 08/29/2023    2:09 PM 09/05/2022    4:43 PM 11/15/2017    5:01 PM  CMP  Glucose 70 - 99 mg/dL 161  86  87   BUN 6 - 23 mg/dL 22   13   Creatinine 0.96 - 1.50 mg/dL 0.45   4.09   Sodium 811 - 145 mEq/L 138   141   Potassium 3.5 - 5.1 mEq/L 4.7   4.4   Chloride 96 - 112 mEq/L 105   103   CO2 19 - 32 mEq/L 26   26    Calcium 8.4 - 10.5 mg/dL 9.0   9.0   Total Protein 6.0 - 8.5 g/dL   6.4   Total Bilirubin 0.0 - 1.2 mg/dL   0.5   Alkaline Phos 39 - 117 IU/L   90   AST 0 - 40 IU/L   29   ALT 0 - 44 IU/L   25    Lab Results  Component Value Date   HGBA1C 6.5 08/29/2023   Assessment & Plan:   Problem List Items Addressed This Visit       Cardiovascular and Mediastinum   Hemorrhoids    Requests refill of his hydrocortisone suppositories.      Primary hypertension - Primary    Blood pressure is improved today. Continue amlodipine-olmesartan (Azor) 5-20 mg daily.        Other   Prediabetes    The A1c is elevated, but his blood sugar was not at diagnostic levels and he has no significant hyperglycemia symptoms. He does not meet ADA criteria as of yet to establish diabetes without confirmation. He could either take the metformin or hold this for the time being.       Other Visit Diagnoses     Acute right eye pain       Etiology is unclear. I recommend he go to an eye clinic before leaving town and request to have his eye pressure checked to r/o acute glaucoma.       Return in about 4 weeks (around 10/01/2023).   Loyola Mast, MD

## 2023-09-21 ENCOUNTER — Ambulatory Visit (INDEPENDENT_AMBULATORY_CARE_PROVIDER_SITE_OTHER): Payer: Medicare Other | Admitting: Family Medicine

## 2023-09-21 ENCOUNTER — Encounter: Payer: Self-pay | Admitting: Family Medicine

## 2023-09-21 VITALS — BP 170/96 | HR 95 | Temp 97.7°F | Wt 140.0 lb

## 2023-09-21 DIAGNOSIS — I10 Essential (primary) hypertension: Secondary | ICD-10-CM

## 2023-09-21 DIAGNOSIS — R600 Localized edema: Secondary | ICD-10-CM

## 2023-09-21 MED ORDER — OLMESARTAN MEDOXOMIL-HCTZ 20-12.5 MG PO TABS: 1.0000 | ORAL_TABLET | Freq: Every day | ORAL | 0 refills | Status: DC

## 2023-09-21 NOTE — Patient Instructions (Signed)
Stop amlodipine/olmesartan. Start your new blood pressure medication, olmesartan/hydrochlorothiazide, as prescribed each day. Monitor your blood pressure daily and keep a log to bring to your follow-up appointment. If you experience any new or worsening symptoms such as chest pain, shortness of breath, or severe leg swelling, seek medical attention immediately. Schedule an ultrasound of your lower legs as instructed to rule out any possible blood clots.

## 2023-09-21 NOTE — Progress Notes (Unsigned)
Assessment/Plan:   Problem List Items Addressed This Visit   None   There are no discontinued medications.  No follow-ups on file.    Subjective:   Encounter date: 09/21/2023  Ronald Davenport is a 72 y.o. male who has Elevated PSA; BPH (benign prostatic hyperplasia); Chronic back pain; GERD (gastroesophageal reflux disease); Adrenal adenoma; Chronic left-sided low back pain with left-sided sciatica; Bilateral knee pain; Hemorrhoids; History of colon polyps; Prediabetes; and Primary hypertension on their problem list..   He  has a past medical history of Adrenal adenoma, Arthritis, BPH (benign prostatic hyperplasia), BPH with elevated PSA, Cataract, Headache, Hematospermia, and Hematuria.Ronald Davenport   He presents with chief complaint of Foot Swelling (Patient noticed the feet swelling while in [place] for X 1 week. Need rx refill on b/p meds. Left them in [place].) .   HPI:   ROS  Past Surgical History:  Procedure Laterality Date   CATARACT EXTRACTION Left 11/27/2004   PROSTATE BIOPSY     negative in 2011   ROTATOR CUFF REPAIR Right 11/27/2004    Outpatient Medications Prior to Visit  Medication Sig Dispense Refill   amLODipine-olmesartan (AZOR) 5-20 MG tablet Take 1 tablet by mouth every evening. 30 tablet 5   finasteride (PROSCAR) 5 MG tablet Take 5 mg by mouth daily. Reported on 11/16/2015     hydrocortisone (ANUSOL-HC) 25 MG suppository Place 1 suppository (25 mg total) rectally 2 (two) times daily. 12 suppository 1   metFORMIN (GLUCOPHAGE) 500 MG tablet Take 0.5 tablets (250 mg total) by mouth 2 (two) times daily with a meal. 30 tablet 5   ibuprofen (ADVIL,MOTRIN) 800 MG tablet Take 1 tablet (800 mg total) by mouth every 8 (eight) hours as needed. (Patient not taking: Reported on 09/21/2023) 30 tablet 0   No facility-administered medications prior to visit.    Family History  Problem Relation Age of Onset   Stroke Mother    Diabetes Sister    Colon polyps Brother    Colon  cancer Brother    Heart disease Brother    Esophageal cancer Neg Hx    Rectal cancer Neg Hx    Stomach cancer Neg Hx     Social History   Socioeconomic History   Marital status: Widowed    Spouse name: Not on file   Number of children: 3   Years of education: College    Highest education level: Bachelor's degree (e.g., BA, AB, BS)  Occupational History   Occupation: disability    Comment: DDD lumbar/chronic back pain  Tobacco Use   Smoking status: Never   Smokeless tobacco: Never  Vaping Use   Vaping status: Never Used  Substance and Sexual Activity   Alcohol use: Not Currently    Comment: occasionally - maybe 1-2 times per year.   Drug use: No   Sexual activity: Yes  Other Topics Concern   Not on file  Social History Narrative   From [place]; [place] since 1980s.   Social Determinants of Health   Financial Resource Strain: Low Risk  (10/31/2022)   Overall Financial Resource Strain (CARDIA)    Difficulty of Paying Living Expenses: Not hard at all  Food Insecurity: No Food Insecurity (10/31/2022)   Hunger Vital Sign    Worried About Running Out of Food in the Last Year: Never true    Ran Out of Food in the Last Year: Never true  Transportation Needs: No Transportation Needs (10/31/2022)   PRAPARE - Transportation    Lack of  Transportation (Medical): No    Lack of Transportation (Non-Medical): No  Physical Activity: Inactive (10/31/2022)   Exercise Vital Sign    Days of Exercise per Week: 0 days    Minutes of Exercise per Session: 0 min  Stress: No Stress Concern Present (10/31/2022)   [employer] of Occupational Health - Occupational Stress Questionnaire    Feeling of Stress : Not at all  Social Connections: Not on file  Intimate Partner Violence: Not on file                                                                                                  Objective:  Physical Exam: BP (!) 182/100 (BP Location: Left Arm, Patient Position: Sitting, Cuff Size:  Large)   Pulse 95   Temp 97.7 F (36.5 C) (Temporal)   Wt 140 lb (63.5 kg)   SpO2 97%   BMI 21.29 kg/m     Physical Exam  DG Lumbar Spine 2-3 Views  Result Date: 09/03/2023 CLINICAL DATA:  Back pain. EXAM: LUMBAR SPINE - 3 VIEW COMPARISON:  12/06/2022. FINDINGS: Degenerative disc disease T12-L1. Facet joint degenerative changes especially at L4-5. No significant compression deformities or spondylolisthesis. No focal osteolytic or osteoblastic changes. IMPRESSION: Extensive degenerative changes. Electronically Signed   By: Layla Maw M.D.   On: 09/03/2023 08:19    Recent Results (from the past 2160 hour(s))  Hemoglobin A1c     Status: None   Collection Time: 08/29/23  2:09 PM  Result Value Ref Range   Hgb A1c MFr Bld 6.5 4.6 - 6.5 %    Comment: Glycemic Control Guidelines for People with Diabetes:Non Diabetic:  <6%Goal of Therapy: <7%Additional Action Suggested:  >8%   TSH     Status: None   Collection Time: 08/29/23  2:09 PM  Result Value Ref Range   TSH 2.13 0.35 - 5.50 uIU/mL  PSA     Status: Abnormal   Collection Time: 08/29/23  2:09 PM  Result Value Ref Range   PSA 4.61 (H) 0.10 - 4.00 ng/mL    Comment: Test performed using Access Hybritech PSA Assay, a parmagnetic partical, chemiluminecent immunoassay.  Renal Function Panel     Status: Abnormal   Collection Time: 08/29/23  2:09 PM  Result Value Ref Range   Sodium 138 135 - 145 mEq/L   Potassium 4.7 3.5 - 5.1 mEq/L   Chloride 105 96 - 112 mEq/L   CO2 26 19 - 32 mEq/L   Albumin 3.8 3.5 - 5.2 g/dL   BUN 22 6 - 23 mg/dL   Creatinine, Ser 3.47 0.40 - 1.50 mg/dL   Glucose, Bld 425 (H) 70 - 99 mg/dL   Phosphorus 3.8 2.3 - 4.6 mg/dL   GFR 95.63 >87.56 mL/min    Comment: Calculated using the CKD-EPI Creatinine Equation (2021)   Calcium 9.0 8.4 - 10.5 mg/dL        Garner Nash, MD, MS

## 2023-09-22 DIAGNOSIS — R600 Localized edema: Secondary | ICD-10-CM | POA: Insufficient documentation

## 2023-09-22 DIAGNOSIS — I872 Venous insufficiency (chronic) (peripheral): Secondary | ICD-10-CM | POA: Insufficient documentation

## 2023-09-22 NOTE — Assessment & Plan Note (Signed)
Blood pressure currently elevated without antihypertensive medication.  Plan: Discontinue amlodipine due to suspected side effect of leg swelling. Prescribe Olmesartan with Hydrochlorothiazide to replace current medication. Monitor blood pressure closely.

## 2023-09-22 NOTE — Assessment & Plan Note (Signed)
Recent bilateral leg swelling, more pronounced in the right foot, painful but non-erythematous.  Differential Diagnosis: Medication side effect (Amlodipine-induced edema), Deep Vein Thrombosis (DVT), Heart Failure.  Plan: Discontinue amlodipine to assess resolution of leg swelling. Order ultrasound to rule out DVT. Repeat blood work including renal function, electrolytes, BNP to assess for heart failure.

## 2023-09-25 LAB — CBC WITH DIFFERENTIAL/PLATELET
Basophils Absolute: 0 10*3/uL (ref 0.0–0.2)
Basos: 0 %
EOS (ABSOLUTE): 0 10*3/uL (ref 0.0–0.4)
Eos: 0 %
Hematocrit: 38.9 % (ref 37.5–51.0)
Hemoglobin: 13 g/dL (ref 13.0–17.7)
Immature Grans (Abs): 0 10*3/uL (ref 0.0–0.1)
Immature Granulocytes: 0 %
Lymphocytes Absolute: 1.2 10*3/uL (ref 0.7–3.1)
Lymphs: 22 %
MCH: 30 pg (ref 26.6–33.0)
MCHC: 33.4 g/dL (ref 31.5–35.7)
MCV: 90 fL (ref 79–97)
Monocytes Absolute: 0.7 10*3/uL (ref 0.1–0.9)
Monocytes: 12 %
Neutrophils Absolute: 3.7 10*3/uL (ref 1.4–7.0)
Neutrophils: 66 %
Platelets: 259 10*3/uL (ref 150–450)
RBC: 4.34 x10E6/uL (ref 4.14–5.80)
RDW: 13.5 % (ref 11.6–15.4)
WBC: 5.7 10*3/uL (ref 3.4–10.8)

## 2023-09-25 LAB — COMPREHENSIVE METABOLIC PANEL
ALT: 32 IU/L (ref 0–44)
AST: 24 IU/L (ref 0–40)
Albumin: 3.8 g/dL (ref 3.8–4.8)
Alkaline Phosphatase: 140 IU/L — ABNORMAL HIGH (ref 44–121)
BUN/Creatinine Ratio: 17 (ref 10–24)
BUN: 16 mg/dL (ref 8–27)
Bilirubin Total: 0.6 mg/dL (ref 0.0–1.2)
CO2: 25 mmol/L (ref 20–29)
Calcium: 8.9 mg/dL (ref 8.6–10.2)
Chloride: 100 mmol/L (ref 96–106)
Creatinine, Ser: 0.93 mg/dL (ref 0.76–1.27)
Globulin, Total: 2.4 g/dL (ref 1.5–4.5)
Glucose: 84 mg/dL (ref 70–99)
Potassium: 4 mmol/L (ref 3.5–5.2)
Sodium: 140 mmol/L (ref 134–144)
Total Protein: 6.2 g/dL (ref 6.0–8.5)
eGFR: 88 mL/min/{1.73_m2} (ref >59)

## 2023-09-25 LAB — BRAIN NATRIURETIC PEPTIDE: BNP: 39.7 pg/mL (ref 0.0–100.0)

## 2023-09-25 LAB — URINALYSIS, ROUTINE W REFLEX MICROSCOPIC

## 2023-09-26 ENCOUNTER — Other Ambulatory Visit: Payer: Self-pay | Admitting: Family Medicine

## 2023-09-26 DIAGNOSIS — I10 Essential (primary) hypertension: Secondary | ICD-10-CM

## 2023-09-27 ENCOUNTER — Telehealth: Payer: Self-pay

## 2023-09-27 DIAGNOSIS — I10 Essential (primary) hypertension: Secondary | ICD-10-CM

## 2023-09-27 MED ORDER — OLMESARTAN MEDOXOMIL-HCTZ 20-12.5 MG PO TABS: 1.0000 | ORAL_TABLET | Freq: Every day | ORAL | 0 refills | Status: DC

## 2023-09-27 NOTE — Telephone Encounter (Signed)
Incoming fax from [employer] and patient requesting 90 day supply of Benicar HCT 20-12.5 tab. Ok for 90 day supply?

## 2023-09-27 NOTE — Addendum Note (Signed)
Addended by: Trudee Kuster on: 09/27/2023 03:18 PM   Modules accepted: Orders

## 2023-09-28 ENCOUNTER — Encounter: Payer: Self-pay | Admitting: Family Medicine

## 2023-09-28 ENCOUNTER — Ambulatory Visit (INDEPENDENT_AMBULATORY_CARE_PROVIDER_SITE_OTHER): Payer: Medicare Other | Admitting: Family Medicine

## 2023-09-28 VITALS — BP 110/64 | HR 74 | Temp 98.3°F | Ht 68.0 in | Wt 129.2 lb

## 2023-09-28 DIAGNOSIS — M65332 Trigger finger, left middle finger: Secondary | ICD-10-CM | POA: Insufficient documentation

## 2023-09-28 DIAGNOSIS — Z23 Encounter for immunization: Secondary | ICD-10-CM | POA: Diagnosis not present

## 2023-09-28 DIAGNOSIS — K641 Second degree hemorrhoids: Secondary | ICD-10-CM | POA: Diagnosis not present

## 2023-09-28 DIAGNOSIS — R972 Elevated prostate specific antigen [PSA]: Secondary | ICD-10-CM

## 2023-09-28 DIAGNOSIS — I10 Essential (primary) hypertension: Secondary | ICD-10-CM | POA: Diagnosis not present

## 2023-09-28 MED ORDER — HYDROCORTISONE ACETATE 25 MG RE SUPP: 25.0000 mg | Freq: Two times a day (BID) | RECTAL | 1 refills | Status: DC

## 2023-09-28 NOTE — Assessment & Plan Note (Signed)
Slowly progressive increase over the past year. I recommend a repeat in 6 months.

## 2023-09-28 NOTE — Assessment & Plan Note (Signed)
Mr. Mcevoy requests refill of his hydrocortisone suppositories. He notes he never received these.

## 2023-09-28 NOTE — Assessment & Plan Note (Signed)
Triggering by history of both middle fingers L>R. Previously improved with steroid injection. I will refer Mr. Balfour to Dr. Amanda Pea to consider a repeat injection.

## 2023-09-28 NOTE — Assessment & Plan Note (Signed)
Blood pressure is in good control. Continue olmesartan-HCTZ (Benicar HCT) 20-12.5 mg daily.

## 2023-09-28 NOTE — Progress Notes (Signed)
[facility] [address] [place] [address] Dept: [phone] Dept Fax: [phone]  Office Visit  Subjective:    Patient ID: Ronald Davenport, male    DOB: Feb 28, 1951, 72 y.o..   MRN: [id number]  Chief Complaint  Patient presents with   Hypertension    1 week f/u BP.     History of Present Illness:  Patient is in today for reassessment of lower leg swelling. Ronald Davenport was seen on 10/2 by by Ms. Nche, having had a week-long history of an elevated blood pressure. His blood pressure was at 170/100 at presentation. He had noted increased stressors related to family issues. Ronald Davenport wife had died suddenly in her sleep on 19-Mar-2023. Ms. Elease Etienne started him on amlodipine-olmesartan (Azor) 5-20 mg daily. I followed up with him on 10/7 and his BP was down to 130/70. He went to [place] to have his wife buried. During his trip, he began to have bilateral lower leg swelling. He also mistakenly left his medicine in [place]. He saw Dr. Janee Morn on 10/25 and his BP was back up to 170/96. His weight was increased 10 lbs. Ronald Davenport was advised to stop taking his Azor and switched to olmesartan-HCTZ 20-12.5 mg daily. He had lab testing and venous ultrasounds of the leg was ordered.  Ronald Davenport notes the swelling is now resolved.  Ronald Davenport has a past history of a trigger finger. He states he received injection of this in the past. He has started having triggering again of both the left and right middle fingers, with a little bit also in the ring fingers.  Past Medical History: Patient Active Problem List   Diagnosis Date Noted   Trigger finger, left middle finger 09/28/2023   Bilateral lower extremity edema 09/22/2023   Primary hypertension 08/29/2023   Prediabetes 09/06/2022   Bilateral knee pain 09/05/2022   Hemorrhoids 09/05/2022   History of colon polyps 09/05/2022   Chronic left-sided low back pain with left-sided sciatica 01/05/2017   Adrenal  adenoma 11/16/2015   GERD (gastroesophageal reflux disease) 11/28/2014   Chronic back pain 11/27/2014   BPH (benign prostatic hyperplasia) 09/07/2014   Elevated PSA 02/12/2013   Past Surgical History:  Procedure Laterality Date   CATARACT EXTRACTION Left 11/27/2004   PROSTATE BIOPSY     negative in 2011   ROTATOR CUFF REPAIR Right 11/27/2004   Family History  Problem Relation Age of Onset   Stroke Mother    Diabetes Sister    Colon polyps Brother    Colon cancer Brother    Heart disease Brother    Esophageal cancer Neg Hx    Rectal cancer Neg Hx    Stomach cancer Neg Hx    Outpatient Medications Prior to Visit  Medication Sig Dispense Refill   finasteride (PROSCAR) 5 MG tablet Take 5 mg by mouth daily. Reported on 11/16/2015     ibuprofen (ADVIL,MOTRIN) 800 MG tablet Take 1 tablet (800 mg total) by mouth every 8 (eight) hours as needed. 30 tablet 0   metFORMIN (GLUCOPHAGE) 500 MG tablet Take 0.5 tablets (250 mg total) by mouth 2 (two) times daily with a meal. 30 tablet 5   olmesartan-hydrochlorothiazide (BENICAR HCT) 20-12.5 MG tablet Take 1 tablet by mouth daily. 90 tablet 0   hydrocortisone (ANUSOL-HC) 25 MG suppository Place 1 suppository (25 mg total) rectally 2 (two) times daily. 12 suppository 1   No facility-administered medications prior to visit.   No Known Allergies  Objective:   Today's Vitals   09/28/23 1510  BP: 110/64  Pulse: 74  Temp: 98.3 F (36.8 C)  TempSrc: Temporal  SpO2: 99%  Weight: 129 lb 3.2 oz (58.6 kg)  Height: 5\' 8"  (1.727 m)   Body mass index is 19.64 kg/m.   General: Well developed, well nourished. No acute distress. Extremities: Full ROM in the hands. No sign of triggering at present. No joint swelling or tenderness. No edema of the   lower legs. Psych: Alert and oriented. Normal mood and affect.  Health Maintenance Due  Topic Date Due   DTaP/Tdap/Td (1 - Tdap) Never done   Zoster Vaccines- Shingrix (1 of 2) Never done    Medicare Annual Wellness (AWV)  11/01/2023   Lab Results    Latest Ref Rng & Units 09/21/2023    3:16 PM 05/15/2017    5:43 PM 12/27/2016    6:22 PM  CBC  WBC 3.4 - 10.8 x10E3/uL 5.7  3.4  3.5   Hemoglobin 13.0 - 17.7 g/dL 14.7  82.9  56.2   Hematocrit 37.5 - 51.0 % 38.9  42.4  42.7   Platelets 150 - 450 x10E3/uL 259  217  198       Latest Ref Rng & Units 09/21/2023    3:16 PM 08/29/2023    2:09 PM 09/05/2022    4:43 PM  CMP  Glucose 70 - 99 mg/dL 84  130  86   BUN 8 - 27 mg/dL 16  22    Creatinine 8.65 - 1.27 mg/dL 7.84  6.96    Sodium 295 - 144 mmol/L 140  138    Potassium 3.5 - 5.2 mmol/L 4.0  4.7    Chloride 96 - 106 mmol/L 100  105    CO2 20 - 29 mmol/L 25  26    Calcium 8.6 - 10.2 mg/dL 8.9  9.0    Total Protein 6.0 - 8.5 g/dL 6.2     Total Bilirubin 0.0 - 1.2 mg/dL 0.6     Alkaline Phos 44 - 121 IU/L 140     AST 0 - 40 IU/L 24     ALT 0 - 44 IU/L 32      BNP    Component Value Date/Time   BNP 39.7 09/21/2023 1516   Component Ref Range & Units 1 mo ago 12 mo ago 1 yr ago 8 yr ago 10 yr ago  PSA 0.10 - 4.00 ng/mL 4.61 High  4.55 High  CM 4.05 High  CM 3.15 R, CM 5.27 High      Assessment & Plan:   Problem List Items Addressed This Visit       Cardiovascular and Mediastinum   Hemorrhoids    Ronald Davenport requests refill of his hydrocortisone suppositories. He notes he never received these.      Relevant Medications   hydrocortisone (ANUSOL-HC) 25 MG suppository   Primary hypertension - Primary    Blood pressure is in good control. Continue olmesartan-HCTZ (Benicar HCT) 20-12.5 mg daily.        Musculoskeletal and Integument   Trigger finger, left middle finger    Triggering by history of both middle fingers L>R. Previously improved with steroid injection. I will refer Ronald Davenport to Dr. Amanda Pea to consider a repeat injection.      Relevant Orders   Ambulatory referral to Orthopedics     Other   Elevated PSA    Slowly progressive increase over the past  year. I recommend  a repeat in 6 months.       Other Visit Diagnoses     Need for immunization against influenza       Relevant Orders   Flu Vaccine Trivalent High Dose (Fluad) (Completed)       Return in about 2 months (around 11/28/2023) for Reassessment.   Loyola Mast, MD

## 2023-10-01 ENCOUNTER — Ambulatory Visit: Payer: Medicare Other | Admitting: Family Medicine

## 2023-10-05 DIAGNOSIS — R1033 Periumbilical pain: Secondary | ICD-10-CM | POA: Diagnosis not present

## 2023-10-05 DIAGNOSIS — R3121 Asymptomatic microscopic hematuria: Secondary | ICD-10-CM | POA: Diagnosis not present

## 2023-10-09 ENCOUNTER — Encounter: Payer: Self-pay | Admitting: Family Medicine

## 2023-10-09 ENCOUNTER — Ambulatory Visit (HOSPITAL_BASED_OUTPATIENT_CLINIC_OR_DEPARTMENT_OTHER)
Admission: RE | Admit: 2023-10-09 | Discharge: 2023-10-09 | Disposition: A | Discharge status: Home / Self Care | Payer: Medicare Other | Source: Ambulatory Visit | Attending: Family Medicine | Admitting: Family Medicine

## 2023-10-09 DIAGNOSIS — R600 Localized edema: Secondary | ICD-10-CM | POA: Insufficient documentation

## 2023-10-10 ENCOUNTER — Other Ambulatory Visit: Payer: Self-pay | Admitting: Urology

## 2023-10-10 DIAGNOSIS — R110 Nausea: Secondary | ICD-10-CM

## 2023-10-10 DIAGNOSIS — R3129 Other microscopic hematuria: Secondary | ICD-10-CM

## 2023-10-10 DIAGNOSIS — R1033 Periumbilical pain: Secondary | ICD-10-CM

## 2023-10-11 ENCOUNTER — Ambulatory Visit: Payer: Medicare Other | Admitting: Dietician

## 2023-10-15 ENCOUNTER — Ambulatory Visit
Admission: RE | Admit: 2023-10-15 | Discharge: 2023-10-15 | Disposition: A | Discharge status: Home / Self Care | Payer: Medicare Other | Source: Ambulatory Visit | Attending: Urology | Admitting: Urology

## 2023-10-15 DIAGNOSIS — R1033 Periumbilical pain: Secondary | ICD-10-CM

## 2023-10-15 DIAGNOSIS — R3129 Other microscopic hematuria: Secondary | ICD-10-CM

## 2023-10-15 DIAGNOSIS — R110 Nausea: Secondary | ICD-10-CM

## 2023-10-17 DIAGNOSIS — M65342 Trigger finger, left ring finger: Secondary | ICD-10-CM | POA: Diagnosis not present

## 2023-10-17 DIAGNOSIS — M65332 Trigger finger, left middle finger: Secondary | ICD-10-CM | POA: Diagnosis not present

## 2023-10-17 DIAGNOSIS — M65331 Trigger finger, right middle finger: Secondary | ICD-10-CM | POA: Diagnosis not present

## 2023-10-18 ENCOUNTER — Encounter: Payer: Self-pay | Admitting: Urology

## 2023-10-23 ENCOUNTER — Telehealth: Payer: Self-pay

## 2023-10-23 NOTE — Telephone Encounter (Signed)
Patient came into the office stating he needed a refill on his BP medications.  I called his pharmacy and he never picked up the RX from 09/27/23 #90, 0 rf.  Pharmacy will get this ready for him and patient notified VIA phone. Dm/cma

## 2023-10-30 ENCOUNTER — Other Ambulatory Visit: Payer: Self-pay | Admitting: Urology

## 2023-10-30 DIAGNOSIS — R932 Abnormal findings on diagnostic imaging of liver and biliary tract: Secondary | ICD-10-CM

## 2023-11-22 ENCOUNTER — Other Ambulatory Visit: Payer: Medicare Other

## 2023-11-23 ENCOUNTER — Ambulatory Visit
Admission: RE | Admit: 2023-11-23 | Discharge: 2023-11-23 | Disposition: A | Discharge status: Home / Self Care | Payer: Medicare Other | Source: Ambulatory Visit | Attending: Urology | Admitting: Urology

## 2023-11-23 DIAGNOSIS — D1803 Hemangioma of intra-abdominal structures: Secondary | ICD-10-CM | POA: Diagnosis not present

## 2023-11-23 DIAGNOSIS — R932 Abnormal findings on diagnostic imaging of liver and biliary tract: Secondary | ICD-10-CM

## 2023-11-23 DIAGNOSIS — D3501 Benign neoplasm of right adrenal gland: Secondary | ICD-10-CM | POA: Diagnosis not present

## 2023-11-23 MED ORDER — GADOPICLENOL 0.5 MMOL/ML IV SOLN
6.0000 mL | Freq: Once | INTRAVENOUS | Status: AC | PRN
  Administered 2023-11-23: 6 mL via INTRAVENOUS

## 2023-11-30 ENCOUNTER — Telehealth: Payer: Self-pay | Admitting: Family Medicine

## 2023-11-30 ENCOUNTER — Ambulatory Visit: Payer: Medicare Other | Admitting: Family Medicine

## 2023-11-30 NOTE — Telephone Encounter (Signed)
 01.03.2025 no show

## 2023-12-05 NOTE — Telephone Encounter (Signed)
 04/09/2023 no show 11/30/2023 no show  Final warning sent via mail & sent text

## 2023-12-12 ENCOUNTER — Ambulatory Visit: Payer: Medicare Other | Admitting: Family Medicine

## 2023-12-18 ENCOUNTER — Ambulatory Visit: Payer: Medicare Other | Admitting: Family Medicine

## 2023-12-21 ENCOUNTER — Encounter: Payer: Self-pay | Admitting: Family Medicine

## 2024-01-08 ENCOUNTER — Telehealth: Payer: Self-pay

## 2024-01-08 NOTE — Telephone Encounter (Signed)
Copied from CRM [id number]. Topic: Appointments - Scheduling Inquiry for Clinic >> Jan 07, 2024  4:03 PM Elizebeth Brooking wrote: Reason for CRM: Patient called in wanting to schedule appointment , informed him that he has been dismissed from the practice because of his 3rd no show, he stated that it wasn't intentionally and he was dealing with a death in the family, he stated he really needs to see Dr.Rudd and would like a call back regarding this issue

## 2024-01-08 NOTE — Telephone Encounter (Signed)
Missed Visits: 04/09/2023, 11/30/2023, and 12/18/2023   Please advise if you want to reverse dismissal per below request.

## 2024-01-08 NOTE — Telephone Encounter (Signed)
This goes along with tele encounter from 11/30/23.

## 2024-01-08 NOTE — Telephone Encounter (Signed)
Reversed dismissal. Please call pt to notify and advise additional missed visits will result in dismissal.

## 2024-01-09 NOTE — Telephone Encounter (Signed)
Lvmtcb to schedule an appt, he called [facility] and scheduled one.

## 2024-01-14 DIAGNOSIS — R3121 Asymptomatic microscopic hematuria: Secondary | ICD-10-CM | POA: Diagnosis not present

## 2024-01-16 ENCOUNTER — Ambulatory Visit: Payer: Medicare Other | Admitting: Family Medicine

## 2024-01-19 ENCOUNTER — Observation Stay (HOSPITAL_COMMUNITY)
Admission: EM | Admit: 2024-01-19 | Discharge: 2024-01-20 | Disposition: A | Discharge status: Home / Self Care | Payer: Medicare Other | Attending: Family Medicine | Admitting: Family Medicine

## 2024-01-19 ENCOUNTER — Emergency Department (HOSPITAL_COMMUNITY): Payer: Medicare Other

## 2024-01-19 ENCOUNTER — Observation Stay (HOSPITAL_BASED_OUTPATIENT_CLINIC_OR_DEPARTMENT_OTHER): Payer: Medicare Other

## 2024-01-19 ENCOUNTER — Other Ambulatory Visit: Payer: Self-pay

## 2024-01-19 DIAGNOSIS — M549 Dorsalgia, unspecified: Secondary | ICD-10-CM | POA: Insufficient documentation

## 2024-01-19 DIAGNOSIS — Z79899 Other long term (current) drug therapy: Secondary | ICD-10-CM | POA: Diagnosis not present

## 2024-01-19 DIAGNOSIS — R404 Transient alteration of awareness: Secondary | ICD-10-CM | POA: Diagnosis not present

## 2024-01-19 DIAGNOSIS — I10 Essential (primary) hypertension: Secondary | ICD-10-CM | POA: Insufficient documentation

## 2024-01-19 DIAGNOSIS — J929 Pleural plaque without asbestos: Secondary | ICD-10-CM | POA: Diagnosis not present

## 2024-01-19 DIAGNOSIS — E860 Dehydration: Secondary | ICD-10-CM | POA: Diagnosis not present

## 2024-01-19 DIAGNOSIS — Z1152 Encounter for screening for COVID-19: Secondary | ICD-10-CM | POA: Insufficient documentation

## 2024-01-19 DIAGNOSIS — N179 Acute kidney failure, unspecified: Secondary | ICD-10-CM | POA: Diagnosis not present

## 2024-01-19 DIAGNOSIS — R55 Syncope and collapse: Secondary | ICD-10-CM | POA: Diagnosis not present

## 2024-01-19 DIAGNOSIS — Z7984 Long term (current) use of oral hypoglycemic drugs: Secondary | ICD-10-CM | POA: Insufficient documentation

## 2024-01-19 DIAGNOSIS — I499 Cardiac arrhythmia, unspecified: Secondary | ICD-10-CM | POA: Diagnosis not present

## 2024-01-19 DIAGNOSIS — G8929 Other chronic pain: Secondary | ICD-10-CM | POA: Diagnosis not present

## 2024-01-19 DIAGNOSIS — E119 Type 2 diabetes mellitus without complications: Secondary | ICD-10-CM | POA: Diagnosis not present

## 2024-01-19 DIAGNOSIS — N400 Enlarged prostate without lower urinary tract symptoms: Secondary | ICD-10-CM | POA: Diagnosis not present

## 2024-01-19 LAB — CBC WITH DIFFERENTIAL/PLATELET
Abs Immature Granulocytes: 0.01 10*3/uL (ref 0.00–0.07)
Basophils Absolute: 0 10*3/uL (ref 0.0–0.1)
Basophils Relative: 1 %
Eosinophils Absolute: 0 10*3/uL (ref 0.0–0.5)
Eosinophils Relative: 1 %
HCT: 41.7 % (ref 39.0–52.0)
Hemoglobin: 13.4 g/dL (ref 13.0–17.0)
Immature Granulocytes: 0 %
Lymphocytes Relative: 43 %
Lymphs Abs: 1.9 10*3/uL (ref 0.7–4.0)
MCH: 30.7 pg (ref 26.0–34.0)
MCHC: 32.1 g/dL (ref 30.0–36.0)
MCV: 95.6 fL (ref 80.0–100.0)
Monocytes Absolute: 0.5 10*3/uL (ref 0.1–1.0)
Monocytes Relative: 11 %
Neutro Abs: 2 10*3/uL (ref 1.7–7.7)
Neutrophils Relative %: 44 %
Platelets: 208 10*3/uL (ref 150–400)
RBC: 4.36 MIL/uL (ref 4.22–5.81)
RDW: 13.1 % (ref 11.5–15.5)
WBC: 4.4 10*3/uL (ref 4.0–10.5)
nRBC: 0 % (ref 0.0–0.2)

## 2024-01-19 LAB — URINALYSIS, ROUTINE W REFLEX MICROSCOPIC
Bilirubin Urine: NEGATIVE
Glucose, UA: NEGATIVE mg/dL
Hgb urine dipstick: NEGATIVE
Ketones, ur: NEGATIVE mg/dL
Leukocytes,Ua: NEGATIVE
Nitrite: NEGATIVE
Protein, ur: NEGATIVE mg/dL
Specific Gravity, Urine: 1.017 (ref 1.005–1.030)
pH: 7 (ref 5.0–8.0)

## 2024-01-19 LAB — ECHOCARDIOGRAM COMPLETE
Area-P 1/2: 2.87 cm2
S' Lateral: 2.1 cm
Weight: 2067.03 [oz_av]

## 2024-01-19 LAB — RESP PANEL BY RT-PCR (RSV, FLU A&B, COVID)  RVPGX2
Influenza A by PCR: NEGATIVE
Influenza B by PCR: NEGATIVE
Resp Syncytial Virus by PCR: NEGATIVE
SARS Coronavirus 2 by RT PCR: NEGATIVE

## 2024-01-19 LAB — BASIC METABOLIC PANEL
Anion gap: 9 (ref 5–15)
BUN: 28 mg/dL — ABNORMAL HIGH (ref 8–23)
CO2: 27 mmol/L (ref 22–32)
Calcium: 8.6 mg/dL — ABNORMAL LOW (ref 8.9–10.3)
Chloride: 103 mmol/L (ref 98–111)
Creatinine, Ser: 1.39 mg/dL — ABNORMAL HIGH (ref 0.61–1.24)
GFR, Estimated: 54 mL/min — ABNORMAL LOW (ref >60)
Glucose, Bld: 146 mg/dL — ABNORMAL HIGH (ref 70–99)
Potassium: 3.7 mmol/L (ref 3.5–5.1)
Sodium: 139 mmol/L (ref 135–145)

## 2024-01-19 LAB — TROPONIN I (HIGH SENSITIVITY): Troponin I (High Sensitivity): 3 ng/L (ref <18)

## 2024-01-19 LAB — MAGNESIUM: Magnesium: 2.7 mg/dL — ABNORMAL HIGH (ref 1.7–2.4)

## 2024-01-19 MED ORDER — LACTATED RINGERS IV SOLN: INTRAVENOUS | Status: AC

## 2024-01-19 MED ORDER — ACETAMINOPHEN 325 MG PO TABS: 650.0000 mg | ORAL_TABLET | Freq: Four times a day (QID) | ORAL | Status: DC | PRN

## 2024-01-19 MED ORDER — MELATONIN 5 MG PO TABS: 5.0000 mg | ORAL_TABLET | Freq: Every evening | ORAL | Status: DC | PRN

## 2024-01-19 MED ORDER — SODIUM CHLORIDE 0.9 % IV BOLUS
500.0000 mL | Freq: Once | INTRAVENOUS | Status: AC
  Administered 2024-01-19: 500 mL via INTRAVENOUS

## 2024-01-19 MED ORDER — ENOXAPARIN SODIUM 40 MG/0.4ML IJ SOSY
40.0000 mg | PREFILLED_SYRINGE | INTRAMUSCULAR | Status: DC
  Administered 2024-01-19 – 2024-01-20 (×2): 40 mg via SUBCUTANEOUS
  Filled 2024-01-19 (×2): qty 0.4

## 2024-01-19 MED ORDER — FINASTERIDE 5 MG PO TABS
5.0000 mg | ORAL_TABLET | Freq: Every day | ORAL | Status: DC
  Administered 2024-01-19 – 2024-01-20 (×2): 5 mg via ORAL
  Filled 2024-01-19 (×2): qty 1

## 2024-01-19 MED ORDER — POLYETHYLENE GLYCOL 3350 17 G PO PACK: 17.0000 g | PACK | Freq: Every day | ORAL | Status: DC | PRN

## 2024-01-19 MED ORDER — PROCHLORPERAZINE EDISYLATE 10 MG/2ML IJ SOLN: 5.0000 mg | Freq: Four times a day (QID) | INTRAMUSCULAR | Status: DC | PRN

## 2024-01-19 NOTE — ED Notes (Signed)
 ED TO INPATIENT HANDOFF REPORT  ED Nurse Name and Phone #: Jaziyah Gradel RN  S Name/Age/Gender Ronald Davenport 73 y.o. male Room/Bed: WA23/WA23  Code Status   Code Status: Full Code  Home/SNF/Other Home Patient oriented to: self, place, time, and situation Is this baseline? Yes   Triage Complete: Triage complete  Chief Complaint Syncope [R55]  Triage Note BIB EMS from home.  Was sitting at the table with family.  Reported syncopal episode. No fall.  Family reported doing "CPR" however pt was ambulatory on arrival of EMS.  LOC for about 30 sec.  Pt is wet as his family poured water over him. Complaints of dizziness.  Had headache today and "felt like he was getting a cold"    Allergies Allergies  Allergen Reactions   Amlodipine Other (See Comments)    Edema    Level of Care/Admitting Diagnosis ED Disposition     ED Disposition  Admit   Condition  --   Comment  Hospital Area: [facility] [[id number]]  Level of Care: Telemetry [5]  Admit to tele based on following criteria: Monitor for Ischemic changes  May place patient in observation at [facility] or [facility] if equivalent level of care is available:: Yes  Covid Evaluation: Asymptomatic - no recent exposure (last 10 days) testing not required  Diagnosis: Syncope [206001]  Admitting Physician: Darlin Drop [[id number]]  Attending Physician: Darlin Drop [[id number]]          B Medical/Surgery History Past Medical History:  Diagnosis Date   Adrenal adenoma    left , serial CT scans by urology   Arthritis    BPH (benign prostatic hyperplasia)    with BOO, on finasteride    BPH with elevated PSA    PSA elevated in 2011, prostate bx in 2011 was negative    Cataract    Headache    Hematospermia    Hematuria    gross and microscopic, followed by [facility], neg cystoscopy   Past Surgical History:  Procedure Laterality Date   CATARACT EXTRACTION Left 11/27/2004   PROSTATE BIOPSY      negative in 2011   ROTATOR CUFF REPAIR Right 11/27/2004     A IV Location/Drains/Wounds Patient Lines/Drains/Airways Status     Active Line/Drains/Airways     None            Intake/Output Last 24 hours  Intake/Output Summary (Last 24 hours) at 01/19/2024 1612 Last data filed at 01/19/2024 0530 Gross per 24 hour  Intake 500 ml  Output --  Net 500 ml    Labs/Imaging Results for orders placed or performed during the hospital encounter of 01/19/24 (from the past 48 hours)  CBC with Differential     Status: None   Collection Time: 01/19/24  1:13 AM  Result Value Ref Range   WBC 4.4 4.0 - 10.5 K/uL   RBC 4.36 4.22 - 5.81 MIL/uL   Hemoglobin 13.4 13.0 - 17.0 g/dL   HCT 13.2 44.0 - 10.2 %   MCV 95.6 80.0 - 100.0 fL   MCH 30.7 26.0 - 34.0 pg   MCHC 32.1 30.0 - 36.0 g/dL   RDW 72.5 36.6 - 44.0 %   Platelets 208 150 - 400 K/uL   nRBC 0.0 0.0 - 0.2 %   Neutrophils Relative % 44 %   Neutro Abs 2.0 1.7 - 7.7 K/uL   Lymphocytes Relative 43 %   Lymphs Abs 1.9 0.7 - 4.0 K/uL  Monocytes Relative 11 %   Monocytes Absolute 0.5 0.1 - 1.0 K/uL   Eosinophils Relative 1 %   Eosinophils Absolute 0.0 0.0 - 0.5 K/uL   Basophils Relative 1 %   Basophils Absolute 0.0 0.0 - 0.1 K/uL   Immature Granulocytes 0 %   Abs Immature Granulocytes 0.01 0.00 - 0.07 K/uL    Comment: Performed at [facility], [address] [place], [place] [address]  Resp panel by RT-PCR (RSV, Flu A&B, Covid) Anterior Nasal Swab     Status: None   Collection Time: 01/19/24  1:13 AM   Specimen: Anterior Nasal Swab  Result Value Ref Range   SARS Coronavirus 2 by RT PCR NEGATIVE NEGATIVE    Comment: (NOTE) SARS-CoV-2 target nucleic acids are NOT DETECTED.  The SARS-CoV-2 RNA is generally detectable in upper respiratory specimens during the acute phase of infection. The lowest concentration of SARS-CoV-2 viral copies this assay can detect is 138 copies/mL. A negative result does not preclude  SARS-Cov-2 infection and should not be used as the sole basis for treatment or other patient management decisions. A negative result may occur with  improper specimen collection/handling, submission of specimen other than nasopharyngeal swab, presence of viral mutation(s) within the areas targeted by this assay, and inadequate number of viral copies(<138 copies/mL). A negative result must be combined with clinical observations, patient history, and epidemiological information. The expected result is Negative.  Fact Sheet for Patients:  BloggerCourse.com  Fact Sheet for Healthcare Providers:  SeriousBroker.it  This test is no t yet approved or cleared by the [place] FDA and  has been authorized for detection and/or diagnosis of SARS-CoV-2 by FDA under an Emergency Use Authorization (EUA). This EUA will remain  in effect (meaning this test can be used) for the duration of the COVID-19 declaration under Section 564(b)(1) of the Act, 21 U.S.C.section 360bbb-3(b)(1), unless the authorization is terminated  or revoked sooner.       Influenza A by PCR NEGATIVE NEGATIVE   Influenza B by PCR NEGATIVE NEGATIVE    Comment: (NOTE) The Xpert Xpress SARS-CoV-2/FLU/RSV plus assay is intended as an aid in the diagnosis of influenza from Nasopharyngeal swab specimens and should not be used as a sole basis for treatment. Nasal washings and aspirates are unacceptable for Xpert Xpress SARS-CoV-2/FLU/RSV testing.  Fact Sheet for Patients: BloggerCourse.com  Fact Sheet for Healthcare Providers: SeriousBroker.it  This test is not yet approved or cleared by the [place] FDA and has been authorized for detection and/or diagnosis of SARS-CoV-2 by FDA under an Emergency Use Authorization (EUA). This EUA will remain in effect (meaning this test can be used) for the duration of the COVID-19  declaration under Section 564(b)(1) of the Act, 21 U.S.C. section 360bbb-3(b)(1), unless the authorization is terminated or revoked.     Resp Syncytial Virus by PCR NEGATIVE NEGATIVE    Comment: (NOTE) Fact Sheet for Patients: BloggerCourse.com  Fact Sheet for Healthcare Providers: SeriousBroker.it  This test is not yet approved or cleared by the [place] FDA and has been authorized for detection and/or diagnosis of SARS-CoV-2 by FDA under an Emergency Use Authorization (EUA). This EUA will remain in effect (meaning this test can be used) for the duration of the COVID-19 declaration under Section 564(b)(1) of the Act, 21 U.S.C. section 360bbb-3(b)(1), unless the authorization is terminated or revoked.  Performed at [facility], [address], [place], [place] [address]   Basic metabolic panel  Status: Abnormal   Collection Time: 01/19/24  2:50 AM  Result Value Ref Range   Sodium 139 135 - 145 mmol/L   Potassium 3.7 3.5 - 5.1 mmol/L   Chloride 103 98 - 111 mmol/L   CO2 27 22 - 32 mmol/L   Glucose, Bld 146 (H) 70 - 99 mg/dL    Comment: Glucose reference range applies only to samples taken after fasting for at least 8 hours.   BUN 28 (H) 8 - 23 mg/dL   Creatinine, Ser 5.78 (H) 0.61 - 1.24 mg/dL   Calcium 8.6 (L) 8.9 - 10.3 mg/dL   GFR, Estimated 54 (L) >60 mL/min    Comment: (NOTE) Calculated using the CKD-EPI Creatinine Equation (2021)    Anion gap 9 5 - 15    Comment: Performed at [facility], [address], [place], [place] [address]  Magnesium     Status: Abnormal   Collection Time: 01/19/24  2:50 AM  Result Value Ref Range   Magnesium 2.7 (H) 1.7 - 2.4 mg/dL    Comment: Performed at [facility], [address] [place], [place] [address]  Troponin I (High Sensitivity)     Status: None   Collection Time: 01/19/24  2:50 AM  Result Value Ref Range    Troponin I (High Sensitivity) 3 <18 ng/L    Comment: (NOTE) Elevated high sensitivity troponin I (hsTnI) values and significant  changes across serial measurements may suggest ACS but many other  chronic and acute conditions are known to elevate hsTnI results.  Refer to the "Links" section for chest pain algorithms and additional  guidance. Performed at [facility], [address] [place], [place] [address]   Urinalysis, Routine w reflex microscopic -Urine, Clean Catch     Status: None   Collection Time: 01/19/24  6:59 AM  Result Value Ref Range   Color, Urine YELLOW YELLOW   APPearance CLEAR CLEAR   Specific Gravity, Urine 1.017 1.005 - 1.030   pH 7.0 5.0 - 8.0   Glucose, UA NEGATIVE NEGATIVE mg/dL   Hgb urine dipstick NEGATIVE NEGATIVE   Bilirubin Urine NEGATIVE NEGATIVE   Ketones, ur NEGATIVE NEGATIVE mg/dL   Protein, ur NEGATIVE NEGATIVE mg/dL   Nitrite NEGATIVE NEGATIVE   Leukocytes,Ua NEGATIVE NEGATIVE    Comment: Performed at [facility], [address], [place], [place] [address]   ECHOCARDIOGRAM COMPLETE Result Date: 01/19/2024    ECHOCARDIOGRAM REPORT   Patient Name:   Ronald Davenport OZDG Date of Exam: 01/19/2024 Medical Rec #:  [id number]       Height:       68.0 in Accession #:    [id number]      Weight:       129.2 lb Date of Birth:  1951-09-03        BSA:          1.697 m Patient Age:    72 years        BP:           114/80 mmHg Patient Gender: M               HR:           57 bpm. Exam Location:  Inpatient Procedure: 2D Echo, Cardiac Doppler and Color Doppler (Both Spectral and Color            Flow Doppler were utilized during procedure). Indications:    Syncope R55  History:  Patient has no prior history of Echocardiogram examinations.                 Risk Factors:Diabetes and Hypertension.  Sonographer:    Webb Laws Referring Phys: [id number] CAROLE N HALL IMPRESSIONS  1. Left ventricular ejection fraction, by estimation, is 60  to 65%. The left ventricle has normal function. The left ventricle has no regional wall motion abnormalities. There is mild left ventricular hypertrophy. Left ventricular diastolic parameters were normal.  2. Right ventricular systolic function is normal. The right ventricular size is normal.  3. The mitral valve is normal in structure. No evidence of mitral valve regurgitation. No evidence of mitral stenosis.  4. The aortic valve is tricuspid. There is mild calcification of the aortic valve. Aortic valve regurgitation is not visualized. Aortic valve sclerosis is present, with no evidence of aortic valve stenosis.  5. The inferior vena cava is normal in size with greater than 50% respiratory variability, suggesting right atrial pressure of 3 mmHg. FINDINGS  Left Ventricle: Left ventricular ejection fraction, by estimation, is 60 to 65%. The left ventricle has normal function. The left ventricle has no regional wall motion abnormalities. Strain imaging was not performed. The left ventricular internal cavity  size was normal in size. There is mild left ventricular hypertrophy. Left ventricular diastolic parameters were normal. Right Ventricle: The right ventricular size is normal. No increase in right ventricular wall thickness. Right ventricular systolic function is normal. Left Atrium: Left atrial size was normal in size. Right Atrium: Right atrial size was normal in size. Pericardium: There is no evidence of pericardial effusion. Mitral Valve: The mitral valve is normal in structure. No evidence of mitral valve regurgitation. No evidence of mitral valve stenosis. Tricuspid Valve: The tricuspid valve is normal in structure. Tricuspid valve regurgitation is trivial. No evidence of tricuspid stenosis. Aortic Valve: The aortic valve is tricuspid. There is mild calcification of the aortic valve. Aortic valve regurgitation is not visualized. Aortic valve sclerosis is present, with no evidence of aortic valve stenosis.  Pulmonic Valve: The pulmonic valve was normal in structure. Pulmonic valve regurgitation is not visualized. No evidence of pulmonic stenosis. Aorta: The aortic root is normal in size and structure. Venous: The inferior vena cava is normal in size with greater than 50% respiratory variability, suggesting right atrial pressure of 3 mmHg. IAS/Shunts: No atrial level shunt detected by color flow Doppler. Additional Comments: 3D imaging was not performed.  LEFT VENTRICLE PLAX 2D LVIDd:         3.90 cm   Diastology LVIDs:         2.10 cm   LV e' medial:    5.87 cm/s LV PW:         0.70 cm   LV E/e' medial:  12.5 LV IVS:        1.20 cm   LV e' lateral:   11.00 cm/s LVOT diam:     1.70 cm   LV E/e' lateral: 6.6 LV SV:         54 LV SV Index:   32 LVOT Area:     2.27 cm  RIGHT VENTRICLE             IVC RV Basal diam:  2.50 cm     IVC diam: 0.90 cm RV S prime:     10.40 cm/s TAPSE (M-mode): 2.4 cm LEFT ATRIUM             Index  RIGHT ATRIUM          Index LA diam:        2.80 cm 1.65 cm/m   RA Area:     8.88 cm LA Vol (A2C):   21.2 ml 12.49 ml/m  RA Volume:   14.40 ml 8.48 ml/m LA Vol (A4C):   22.4 ml 13.20 ml/m LA Biplane Vol: 21.8 ml 12.84 ml/m  AORTIC VALVE LVOT Vmax:   119.00 cm/s LVOT Vmean:  73.300 cm/s LVOT VTI:    0.239 m  AORTA Ao Root diam: 3.10 cm Ao Asc diam:  2.70 cm MITRAL VALVE MV Area (PHT): 2.87 cm    SHUNTS MV Decel Time: 264 msec    Systemic VTI:  0.24 m MV E velocity: 73.10 cm/s  Systemic Diam: 1.70 cm MV A velocity: 98.10 cm/s MV E/A ratio:  0.75 Charlton Haws MD Electronically signed by Charlton Haws MD Signature Date/Time: 01/19/2024/3:24:58 PM    Final    DG Chest Portable 1 View Result Date: 01/19/2024 CLINICAL DATA:  Syncope EXAM: PORTABLE CHEST 1 VIEW COMPARISON:  Chest CT 03/20/2016 FINDINGS: There is right apical pleural thickening similar to prior. The lungs are otherwise clear. There is no pleural effusion or pneumothorax. The cardiomediastinal silhouette is within normal limits.  No acute fractures are seen. IMPRESSION: No active disease. Electronically Signed   By: Darliss Cheney M.D.   On: 01/19/2024 01:35    Pending Labs Unresulted Labs (From admission, onward)     Start     Ordered   01/26/24 0500  Creatinine, serum  (enoxaparin (LOVENOX)    CrCl >/= 30 ml/min)  Weekly,   R     Comments: while on enoxaparin therapy    01/19/24 0436   01/20/24 0500  CBC  Tomorrow morning,   R        01/19/24 1425   01/20/24 0500  Basic metabolic panel  Tomorrow morning,   R        01/19/24 1425            Vitals/Pain Today's Vitals   01/19/24 0900 01/19/24 0947 01/19/24 1253 01/19/24 1430  BP: 127/71 126/69 120/67 114/80  Pulse: 63 66 61 (!) 55  Resp: 13 17 16 17   Temp: 97.6 F (36.4 C)  98.2 F (36.8 C)   TempSrc:   Oral   SpO2: 100% 100% 100% 100%  Weight:      PainSc:  0-No pain 0-No pain     Isolation Precautions No active isolations  Medications Medications  enoxaparin (LOVENOX) injection 40 mg (40 mg Subcutaneous Given 01/19/24 0943)  finasteride (PROSCAR) tablet 5 mg (5 mg Oral Given 01/19/24 0942)  lactated ringers infusion ( Intravenous New Bag/Given 01/19/24 0645)  acetaminophen (TYLENOL) tablet 650 mg (has no administration in time range)  prochlorperazine (COMPAZINE) injection 5 mg (has no administration in time range)  melatonin tablet 5 mg (has no administration in time range)  polyethylene glycol (MIRALAX / GLYCOLAX) packet 17 g (has no administration in time range)  sodium chloride 0.9 % bolus 500 mL (0 mLs Intravenous Stopped 01/19/24 0530)    Mobility walks     Focused Assessments Cardiac Assessment Handoff:    No results found for: "CKTOTAL", "CKMB", "CKMBINDEX", "TROPONINI" No results found for: "DDIMER" Does the Patient currently have chest pain? No    R Recommendations: See Admitting Provider Note  Report given to:   Additional Notes: Pt is pleasant, alert, ambulatory. Very cordial and nice.  Family visits periodically.  No complaints

## 2024-01-19 NOTE — ED Triage Notes (Signed)
 BIB EMS from home.  Was sitting at the table with family.  Reported syncopal episode. No fall.  Family reported doing "CPR" however pt was ambulatory on arrival of EMS.  LOC for about 30 sec.  Pt is wet as his family poured water over him. Complaints of dizziness.  Had headache today and "felt like he was getting a cold"

## 2024-01-19 NOTE — ED Notes (Signed)
 Pt ambulated to bathroom

## 2024-01-19 NOTE — ED Provider Notes (Signed)
 [place] EMERGENCY DEPARTMENT AT [facility] Provider Note   CSN: [id number] Arrival date & time: 01/19/24  0055     History  Chief Complaint  Patient presents with   Loss of Consciousness    Ronald Davenport is a 73 y.o. male.  The history is provided by the patient.  Loss of Consciousness Episode history:  Single Most recent episode:  Today Timing:  Constant Progression:  Resolved Chronicity:  New Context: sitting down   Witnessed: yes   Relieved by:  Nothing Worsened by:  Nothing Ineffective treatments:  None tried Associated symptoms: malaise/fatigue   Associated symptoms: no chest pain, no fever, no focal weakness, no nausea, no visual change, no vomiting and no weakness   Patient with BPH presents with     Past Medical History:  Diagnosis Date   Adrenal adenoma    left , serial CT scans by urology   Arthritis    BPH (benign prostatic hyperplasia)    with BOO, on finasteride    BPH with elevated PSA    PSA elevated in 2011, prostate bx in 2011 was negative    Cataract    Headache    Hematospermia    Hematuria    gross and microscopic, followed by [facility], neg cystoscopy     Home Medications Prior to Admission medications   Medication Sig Start Date End Date Taking? Authorizing Provider  finasteride (PROSCAR) 5 MG tablet Take 5 mg by mouth daily. Reported on 11/16/2015    [provider]  hydrocortisone (ANUSOL-HC) 25 MG suppository Place 1 suppository (25 mg total) rectally 2 (two) times daily. 09/28/23   Loyola Mast, MD  ibuprofen (ADVIL,MOTRIN) 800 MG tablet Take 1 tablet (800 mg total) by mouth every 8 (eight) hours as needed. 05/29/17   Trena Platt D, PA  metFORMIN (GLUCOPHAGE) 500 MG tablet Take 0.5 tablets (250 mg total) by mouth 2 (two) times daily with a meal. 08/30/23   Nche, Bonna Gains, NP  olmesartan-hydrochlorothiazide (BENICAR HCT) 20-12.5 MG tablet Take 1 tablet by mouth daily. 09/27/23 12/26/23   Garnette Gunner, MD      Allergies    Amlodipine    Review of Systems   Review of Systems  Constitutional:  Positive for malaise/fatigue. Negative for fever.  Cardiovascular:  Positive for syncope. Negative for chest pain.  Gastrointestinal:  Negative for nausea and vomiting.  Neurological:  Negative for focal weakness and weakness.  All other systems reviewed and are negative.   Physical Exam Updated Vital Signs BP 98/62 (BP Location: Left Arm)   Pulse 63   Temp 98 F (36.7 C) (Oral)   Resp 17   SpO2 100%  Physical Exam Vitals and nursing note reviewed.  Constitutional:      General: He is not in acute distress.    Appearance: He is well-developed. He is not diaphoretic.  HENT:     Head: Normocephalic and atraumatic.     Nose: Nose normal.  Eyes:     Conjunctiva/sclera: Conjunctivae normal.     Pupils: Pupils are equal, round, and reactive to light.  Cardiovascular:     Rate and Rhythm: Normal rate and regular rhythm.     Pulses: Normal pulses.     Heart sounds: Normal heart sounds.  Pulmonary:     Effort: Pulmonary effort is normal.     Breath sounds: Normal breath sounds. No wheezing or rales.  Abdominal:     General: Bowel sounds are normal.  Palpations: Abdomen is soft.     Tenderness: There is no abdominal tenderness. There is no guarding or rebound.  Musculoskeletal:        General: Normal range of motion.     Cervical back: Normal range of motion and neck supple.  Skin:    General: Skin is warm and dry.     Capillary Refill: Capillary refill takes less than 2 seconds.  Neurological:     General: No focal deficit present.     Mental Status: He is alert and oriented to person, place, and time.     Deep Tendon Reflexes: Reflexes normal.  Psychiatric:        Mood and Affect: Mood normal.        Behavior: Behavior normal.     ED Results / Procedures / Treatments   Labs (all labs ordered are listed, but only abnormal results are  displayed) Results for orders placed or performed during the hospital encounter of 01/19/24  Resp panel by RT-PCR (RSV, Flu A&B, Covid) Anterior Nasal Swab   Collection Time: 01/19/24  1:13 AM   Specimen: Anterior Nasal Swab  Result Value Ref Range   SARS Coronavirus 2 by RT PCR NEGATIVE NEGATIVE   Influenza A by PCR NEGATIVE NEGATIVE   Influenza B by PCR NEGATIVE NEGATIVE   Resp Syncytial Virus by PCR NEGATIVE NEGATIVE  CBC with Differential   Collection Time: 01/19/24  1:13 AM  Result Value Ref Range   WBC 4.4 4.0 - 10.5 K/uL   RBC 4.36 4.22 - 5.81 MIL/uL   Hemoglobin 13.4 13.0 - 17.0 g/dL   HCT 16.1 09.6 - 04.5 %   MCV 95.6 80.0 - 100.0 fL   MCH 30.7 26.0 - 34.0 pg   MCHC 32.1 30.0 - 36.0 g/dL   RDW 40.9 81.1 - 91.4 %   Platelets 208 150 - 400 K/uL   nRBC 0.0 0.0 - 0.2 %   Neutrophils Relative % 44 %   Neutro Abs 2.0 1.7 - 7.7 K/uL   Lymphocytes Relative 43 %   Lymphs Abs 1.9 0.7 - 4.0 K/uL   Monocytes Relative 11 %   Monocytes Absolute 0.5 0.1 - 1.0 K/uL   Eosinophils Relative 1 %   Eosinophils Absolute 0.0 0.0 - 0.5 K/uL   Basophils Relative 1 %   Basophils Absolute 0.0 0.0 - 0.1 K/uL   Immature Granulocytes 0 %   Abs Immature Granulocytes 0.01 0.00 - 0.07 K/uL  Basic metabolic panel   Collection Time: 01/19/24  2:50 AM  Result Value Ref Range   Sodium 139 135 - 145 mmol/L   Potassium 3.7 3.5 - 5.1 mmol/L   Chloride 103 98 - 111 mmol/L   CO2 27 22 - 32 mmol/L   Glucose, Bld 146 (H) 70 - 99 mg/dL   BUN 28 (H) 8 - 23 mg/dL   Creatinine, Ser 7.82 (H) 0.61 - 1.24 mg/dL   Calcium 8.6 (L) 8.9 - 10.3 mg/dL   GFR, Estimated 54 (L) >60 mL/min   Anion gap 9 5 - 15  Magnesium   Collection Time: 01/19/24  2:50 AM  Result Value Ref Range   Magnesium 2.7 (H) 1.7 - 2.4 mg/dL  Troponin I (High Sensitivity)   Collection Time: 01/19/24  2:50 AM  Result Value Ref Range   Troponin I (High Sensitivity) 3 <18 ng/L   DG Chest Portable 1 View Result Date: 01/19/2024 CLINICAL  DATA:  Syncope EXAM: PORTABLE CHEST 1 VIEW COMPARISON:  Chest  CT 03/20/2016 FINDINGS: There is right apical pleural thickening similar to prior. The lungs are otherwise clear. There is no pleural effusion or pneumothorax. The cardiomediastinal silhouette is within normal limits. No acute fractures are seen. IMPRESSION: No active disease. Electronically Signed   By: Darliss Cheney M.D.   On: 01/19/2024 01:35     EKG EKG Interpretation Date/Time:  Saturday January 19 2024 01:08:54 EST Ventricular Rate:  67 PR Interval:  153 QRS Duration:  92 QT Interval:  386 QTC Calculation: 408 R Axis:   76  Text Interpretation: Sinus rhythm Probable left ventricular hypertrophy Confirmed by Kalub Morillo ([id number]) on 01/19/2024 1:15:52 AM  Radiology DG Chest Portable 1 View Result Date: 01/19/2024 CLINICAL DATA:  Syncope EXAM: PORTABLE CHEST 1 VIEW COMPARISON:  Chest CT 03/20/2016 FINDINGS: There is right apical pleural thickening similar to prior. The lungs are otherwise clear. There is no pleural effusion or pneumothorax. The cardiomediastinal silhouette is within normal limits. No acute fractures are seen. IMPRESSION: No active disease. Electronically Signed   By: Darliss Cheney M.D.   On: 01/19/2024 01:35    Procedures Procedures    Medications Ordered in ED Medications  sodium chloride 0.9 % bolus 500 mL (has no administration in time range)    ED Course/ Medical Decision Making/ A&P                                 Medical Decision Making Patient with URI symptoms and syncope   Amount and/or Complexity of Data Reviewed Independent Historian: EMS    Details: See above  External Data Reviewed: notes.    Details: Previous notes reviewed  Labs: ordered.    Details: Negative covid and flu.  Normal white count 4.4, normal hemoglobin 13.4, normal platelet count.  Normal sodium 139, normal potassium 3.7, elevated creatinine 1.39, normal troponin 3  Radiology: ordered and independent  interpretation performed.    Details: Negative CXR  ECG/medicine tests: ordered and independent interpretation performed. Decision-making details documented in ED Course.  Risk Decision regarding hospitalization. Risk Details: Patient with syncope and hypotension likely dehydration.  No sisngs of sepsis.      Final Clinical Impression(s) / ED Diagnoses Final diagnoses:  Syncope and collapse  Dehydration   The patient appears reasonably stabilized for admission considering the current resources, flow, and capabilities available in the ED at this time, and I doubt any other [facility] requiring further screening and/or treatment in the ED prior to admission.  Rx / DC Orders ED Discharge Orders     None         Khamauri Bauernfeind, MD 01/19/24 [id number]

## 2024-01-19 NOTE — Progress Notes (Addendum)
  Progress Note   Patient: Ronald Davenport ZOX:[id number] DOB: 1951-01-24 DOA: 01/19/2024     0 DOS: the patient was seen and examined on 01/19/2024   Brief hospital course: 73 yo gentleman originally from [place] with medical history significant for hypertension, BPH, who presents to the ER after a syncopal episode at home.  Reports stressful several weeks, had court case that he won this past Tuesday.  In celebration and gratitude, began fasting regimen on Wed through yesterday, day of admission.  Took his BP meds during this.  Went to daughter's house to break his fast last night, while starting to eat, felt tightness in his neck and then next memory is daughter performing CPR.  Brought to ED for syncope.   *Has had a stressful past year.  Wife died last year, two siblings also died past year from unknown cardiac disorders, other siblings sued him with court resolution this week.    Imp/Plan: Syncope, unclear etiology Working dx is orthostasis in light of 3 day fast plus taking BP meds.   Monitor on telemetry Echo pending.   No personal cardiac history.  Two siblings died in past year from unknown cardiac cause.   No evidence of ACS.  Trops negative.  EKG good.   IVF for rehydration at 125 cc/hr due to prior 3 day fast and AKI as below.  Allow diet.    AKI, suspect prerenal from dehydration Baseline creatinine 0.9 with GFR greater than 60 Presented with creatinine 1.3 with GFR in the 50s Avoid nephrotoxic agents, dehydration, and hypotension Avoid NSAIDs Monitor urine output Continue IV fluid hydration, repeat BMET in AM.   Essential hypertension BPs low on admit, see syncope above.   Holding home oral antihypertensives Closely monitor vital signs   BPH Resume home finasteride   Chronic back pain Avoid NSAIDs   Type 2 diabetes Diet controlled  PPX:  - on lovenox     Subjective: Feels tired on my exam.  But overall much better than before.  Hungry and asking for coffee.  No  complaints.  Does have some lightheadedness when he stands.    Physical Exam: Vitals:   01/19/24 0800 01/19/24 0900 01/19/24 0947 01/19/24 1253  BP: 119/77 127/71 126/69 120/67  Pulse: 60 63 66 61  Resp: 17 13 17 16   Temp:  97.6 F (36.4 C)  98.2 F (36.8 C)  TempSrc:    Oral  SpO2: 100% 100% 100% 100%  Weight:       General: 73 y.o. year-old male well developed well nourished in no acute distress.  Alert and oriented x3. Cardiovascular: Regular rate and rhythm with no rubs or gallops.  Respiratory: Clear to auscultation with no wheezes or rales. Good inspiratory effort. Abdomen: Soft nontender nondistended with normal bowel sounds x4 quadrants. Muskuloskeletal: No cyanosis, clubbing or edema noted bilaterally Neuro: CN II-XII intact, strength, sensation, reflexes Skin: No ulcerative lesions noted or rashes Psychiatry: Judgement and insight appear normal. Mood is appropriate for condition and setting   Disposition: Status is: Observation The patient remains OBS appropriate and will d/c before 2 midnights.  Planned Discharge Destination: Home    Time spent: 35 minutes  Author: Tobey Grim, MD 01/19/2024 2:15 PM  For on call review [email or web address].

## 2024-01-19 NOTE — Progress Notes (Signed)
 PT Cancellation Note  Patient Details Name: Ronald Davenport MRN: [id number] DOB: 1951/08/17   Cancelled Treatment:    Reason Eval/Treat Not Completed: PT screened, no needs identified, will sign off Pt admitted with syncope. Orthostatic BP negative.  Pt has been ambulating to restroom without difficulty per pt and RN.  He denies any lightheadedness or dizziness.  Reports baseline strength, mobility, balance, denies need for PT.  PT agrees and will sign off.  [name], PT Acute Rehab [facility] [phone]   Rayetta Humphrey 01/19/2024, 11:14 AM

## 2024-01-19 NOTE — H&P (Signed)
 History and Physical  Ronald Davenport BJY:[id number] DOB: 1951-04-08 DOA: 01/19/2024  Referring physician: Dr. Daun Peacock, EDP PCP: Loyola Mast, MD  Outpatient Specialists: None. Patient coming from: Home.  Chief Complaint: Loss of consciousness.  HPI: Ronald Davenport is a 74 y.o. male with medical history significant for hypertension, BPH, who presents to the ER after a syncopal episode at home.  Denies any chest pain or palpitations prior to losing consciousness.    Endorses waking up with flulike symptoms this morning.  Had a busy day with errands and ended up taking his blood pressure medication losartan/HCTZ around 6 PM.  Also took a laxative milk of magnesia.  Went to his daughter's house and when he was getting ready to eat his dinner around 11 PM, he felt some tightness around his neck.  Moved his neck to relieve the discomfort. Next thing he remembers his daughter was performing CPR on him.  EMS had been activated.  No prior history of syncope.  States he has 2 brothers who deceased unexpectedly.  They were found to have heart disease at a later age around their 73s.  In the ER, hypotensive which improved after 500 cc IV fluid bolus.  Denies any chest pain.  Not postictal.  EKG with sinus rhythm and biatrial enlargement.  COVID-19, influenza A&B, RSV by PCR all negative.  ED Course: Temperature 97.6.  BP 111/67, pulse 71, respiratory 20, O2 saturation 100% on room air.  Review of Systems: Review of systems as noted in the HPI. All other systems reviewed and are negative.   Past Medical History:  Diagnosis Date   Adrenal adenoma    left , serial CT scans by urology   Arthritis    BPH (benign prostatic hyperplasia)    with BOO, on finasteride    BPH with elevated PSA    PSA elevated in 2011, prostate bx in 2011 was negative    Cataract    Headache    Hematospermia    Hematuria    gross and microscopic, followed by [facility], neg cystoscopy   Past Surgical History:   Procedure Laterality Date   CATARACT EXTRACTION Left 11/27/2004   PROSTATE BIOPSY     negative in 2011   ROTATOR CUFF REPAIR Right 11/27/2004    Social History:  reports that he has never smoked. He has never used smokeless tobacco. He reports that he does not currently use alcohol. He reports that he does not use drugs.   Allergies  Allergen Reactions   Amlodipine Other (See Comments)    Edema    Family History  Problem Relation Age of Onset   Stroke Mother    Diabetes Sister    Colon polyps Brother    Colon cancer Brother    Heart disease Brother    Esophageal cancer Neg Hx    Rectal cancer Neg Hx    Stomach cancer Neg Hx       Prior to Admission medications   Medication Sig Start Date End Date Taking? Authorizing Provider  finasteride (PROSCAR) 5 MG tablet Take 5 mg by mouth daily. Reported on 11/16/2015    [provider]  hydrocortisone (ANUSOL-HC) 25 MG suppository Place 1 suppository (25 mg total) rectally 2 (two) times daily. 09/28/23   Loyola Mast, MD  ibuprofen (ADVIL,MOTRIN) 800 MG tablet Take 1 tablet (800 mg total) by mouth every 8 (eight) hours as needed. 05/29/17   Trena Platt D, PA  metFORMIN (GLUCOPHAGE) 500 MG tablet Take  0.5 tablets (250 mg total) by mouth 2 (two) times daily with a meal. 08/30/23   Nche, Bonna Gains, NP  olmesartan-hydrochlorothiazide (BENICAR HCT) 20-12.5 MG tablet Take 1 tablet by mouth daily. 09/27/23 12/26/23  Garnette Gunner, MD    Physical Exam: BP 98/62 (BP Location: Left Arm)   Pulse 63   Temp 98 F (36.7 C) (Oral)   Resp 17   SpO2 100%   General: 73 y.o. year-old male well developed well nourished in no acute distress.  Alert and oriented x3. Cardiovascular: Regular rate and rhythm with no rubs or gallops.  No thyromegaly or JVD noted.  No lower extremity edema. 2/4 pulses in all 4 extremities. Respiratory: Clear to auscultation with no wheezes or rales. Good inspiratory effort. Abdomen: Soft nontender  nondistended with normal bowel sounds x4 quadrants. Muskuloskeletal: No cyanosis, clubbing or edema noted bilaterally Neuro: CN II-XII intact, strength, sensation, reflexes Skin: No ulcerative lesions noted or rashes Psychiatry: Judgement and insight appear normal. Mood is appropriate for condition and setting          Labs on Admission:  Basic Metabolic Panel: Recent Labs  Lab 01/19/24 0250  NA 139  K 3.7  CL 103  CO2 27  GLUCOSE 146*  BUN 28*  CREATININE 1.39*  CALCIUM 8.6*  MG 2.7*   Liver Function Tests: No results for input(s): "AST", "ALT", "ALKPHOS", "BILITOT", "PROT", "ALBUMIN" in the last 168 hours. No results for input(s): "LIPASE", "AMYLASE" in the last 168 hours. No results for input(s): "AMMONIA" in the last 168 hours. CBC: Recent Labs  Lab 01/19/24 0113  WBC 4.4  NEUTROABS 2.0  HGB 13.4  HCT 41.7  MCV 95.6  PLT 208   Cardiac Enzymes: No results for input(s): "CKTOTAL", "CKMB", "CKMBINDEX", "TROPONINI" in the last 168 hours.  BNP (last 3 results) Recent Labs    09/21/23 1516  BNP 39.7    ProBNP (last 3 results) No results for input(s): "PROBNP" in the last 8760 hours.  CBG: No results for input(s): "GLUCAP" in the last 168 hours.  Radiological Exams on Admission: DG Chest Portable 1 View Result Date: 01/19/2024 CLINICAL DATA:  Syncope EXAM: PORTABLE CHEST 1 VIEW COMPARISON:  Chest CT 03/20/2016 FINDINGS: There is right apical pleural thickening similar to prior. The lungs are otherwise clear. There is no pleural effusion or pneumothorax. The cardiomediastinal silhouette is within normal limits. No acute fractures are seen. IMPRESSION: No active disease. Electronically Signed   By: Darliss Cheney M.D.   On: 01/19/2024 01:35    EKG: I independently viewed the EKG done and my findings are as followed: Sinus rhythm rate of 73.  Nonspecific ST-T changes.  Biatrial enlargement.  Assessment/Plan Present on Admission:  Syncope  Principal Problem:    Syncope  Syncope, unclear etiology Obtain orthostatic vital signs Monitor on telemetry Follow 2D echo PT to mobilize, fall precautions  AKI, suspect prerenal from dehydration Baseline creatinine 0.9 with GFR greater than 60 Presented with creatinine 1.3 with GFR in the 50s Avoid nephrotoxic agents, dehydration, and hypotension Avoid NSAIDs Monitor urine output Continue IV fluid hydration  Essential hypertension BPs are currently soft Hold off home oral antihypertensives Closely monitor vital signs Follow orthostatic vital signs  BPH Resume home finasteride  Chronic back pain Avoid NSAIDs  Type 2 diabetes Diet controlled   Time 75 minutes.   DVT prophylaxis: Subcu Lovenox daily.  Code Status: Full code.  Family Communication: None at bedside.  Disposition Plan: Admitted to telemetry unit  Consults  called: None.  Admission status: Observation status.   Status is: Observation    Darlin Drop MD [facility] Pager [phone]  If 7PM-7AM, please contact night-coverage [email or web address] Password [facility]  01/19/2024, 4:36 AM

## 2024-01-20 DIAGNOSIS — E119 Type 2 diabetes mellitus without complications: Secondary | ICD-10-CM | POA: Diagnosis not present

## 2024-01-20 DIAGNOSIS — G8929 Other chronic pain: Secondary | ICD-10-CM | POA: Diagnosis not present

## 2024-01-20 DIAGNOSIS — R55 Syncope and collapse: Secondary | ICD-10-CM | POA: Diagnosis not present

## 2024-01-20 DIAGNOSIS — Z7984 Long term (current) use of oral hypoglycemic drugs: Secondary | ICD-10-CM | POA: Diagnosis not present

## 2024-01-20 DIAGNOSIS — Z79899 Other long term (current) drug therapy: Secondary | ICD-10-CM | POA: Diagnosis not present

## 2024-01-20 DIAGNOSIS — Z1152 Encounter for screening for COVID-19: Secondary | ICD-10-CM | POA: Diagnosis not present

## 2024-01-20 DIAGNOSIS — M549 Dorsalgia, unspecified: Secondary | ICD-10-CM | POA: Diagnosis not present

## 2024-01-20 DIAGNOSIS — I10 Essential (primary) hypertension: Secondary | ICD-10-CM | POA: Diagnosis not present

## 2024-01-20 DIAGNOSIS — N179 Acute kidney failure, unspecified: Secondary | ICD-10-CM | POA: Diagnosis not present

## 2024-01-20 LAB — BASIC METABOLIC PANEL
Anion gap: 6 (ref 5–15)
BUN: 15 mg/dL (ref 8–23)
CO2: 29 mmol/L (ref 22–32)
Calcium: 8.9 mg/dL (ref 8.9–10.3)
Chloride: 102 mmol/L (ref 98–111)
Creatinine, Ser: 0.94 mg/dL (ref 0.61–1.24)
GFR, Estimated: >60 mL/min (ref >60)
Glucose, Bld: 90 mg/dL (ref 70–99)
Potassium: 4.3 mmol/L (ref 3.5–5.1)
Sodium: 137 mmol/L (ref 135–145)

## 2024-01-20 LAB — CBC
HCT: 41.3 % (ref 39.0–52.0)
Hemoglobin: 13.2 g/dL (ref 13.0–17.0)
MCH: 30.6 pg (ref 26.0–34.0)
MCHC: 32 g/dL (ref 30.0–36.0)
MCV: 95.8 fL (ref 80.0–100.0)
Platelets: 193 10*3/uL (ref 150–400)
RBC: 4.31 MIL/uL (ref 4.22–5.81)
RDW: 13.1 % (ref 11.5–15.5)
WBC: 4 10*3/uL (ref 4.0–10.5)
nRBC: 0 % (ref 0.0–0.2)

## 2024-01-20 MED ORDER — MUSCLE RUB 10-15 % EX CREA
TOPICAL_CREAM | CUTANEOUS | Status: DC | PRN
  Filled 2024-01-20: qty 85

## 2024-01-20 MED ORDER — MUSCLE RUB 10-15 % EX CREA: 1.0000 | TOPICAL_CREAM | CUTANEOUS | 0 refills | Status: DC | PRN

## 2024-01-20 MED ORDER — OLMESARTAN MEDOXOMIL 20 MG PO TABS: 20.0000 mg | ORAL_TABLET | Freq: Every day | ORAL | 0 refills | Status: DC

## 2024-01-20 NOTE — Discharge Summary (Signed)
 Physician Discharge Summary   Patient: Ronald Davenport MRN: [id number] DOB: [birth year]  Admit date:     01/19/2024  Discharge date: 01/20/24  Discharge Physician: Tobey Grim   PCP: Loyola Mast, MD   Recommendations at discharge:   Patient admitted for syncope.  Remained asymptomatic while in house.  Blood pressure slowly crept upwards.  We discharged him home on just olmesartan rather than olmesartan/HCTZ combination.  Ensure at next follow-up appointment his blood pressure is still well-controlled and not overly hyper or hypotensive Discharged home with a Holter monitor.  EKG without any ischemic changes or concerns in house.  Also had no issues while on telemetry.  He was noted to be somewhat bradycardic which, based on prior office visit notes, seems to be chronic as well.  However may need further follow-up with cards.  Discharge Diagnoses: Principal Problem:   Syncope  Brief Hospital course 73 yo gentleman originally from [place] with medical history significant for hypertension, BPH, who presents to the ER after a syncopal episode at home.  No prior history of syncope.  Reports stressful several weeks, had court case that he won this past Tuesday.  In celebration and gratitude, began fasting regimen on Wed through yesterday, total of 3 days continue to take his blood pressure meds during this.  He went to his daughter's house to break his fast the night of admission.  When he started to eat he felt tightness in his neck and then next memory is daughter performing CPR.  Brought to ED for syncope.  He reports a fairly significant and stressful past year.  Wife died last year, two siblings also died past year from unknown cardiac disorders, other siblings sued him with court resolution this week.    Cardiac workup for syncope was negative while in house.  EKG without no signs of ischemia or electrical abnormalities.  Telemetry remained normal while in house.  Echo also within normal  limits.  He was therefore discharged home in good health.  Already has follow-up appointment with his PCP scheduled in 4 days.  He was discharged home on Holter monitor for 24 more hours of monitoring.  He should follow-up with his PCP with these results.  His blood pressure remained low in the 100s systolic for the first 36 hours while in house.  However on day of discharge he began to creep upwards again and was in the 150s systolic at discharge.  He is also somewhat bradycardic in the 50s.  On review of records and prior office visits this seems to not be abnormal.  Although he did not need cardiology consultation inpatient, it might not be a bad idea for outpatient referral with constellation of symptoms if bradycardia persists or any findings on Holter monitor.  Final diagnosis for his syncope was 3-day fast plus continued usage of blood pressure medicine leading to orthostasis.   Imp/Plan: Syncope, unclear etiology Working dx is orthostasis in light of 3 day fast plus taking BP meds.   Monitor on telemetry Echo pending.   No personal cardiac history.  Two siblings died in past year from unknown cardiac cause.   No evidence of ACS.  Trops negative.  EKG good.   IV fluids while in house.  No cardiac etiology   AKI, suspect prerenal from dehydration Baseline creatinine 0.9 with GFR greater than 60 Presented with creatinine 1.3 with GFR in the 50s With IV fluids his creatinine completely normalized on day of discharge..   Essential hypertension  BPs low on admit, see syncope above.   Antihypertensives held while in house.  Blood pressure began to creep upwards to the 150 systolic.  Therefore he was switched to just olmesartan rather than olmesartan/HCTZ on discharge.  Especially in light of hypotension recently, we are holding his thiazide diuretic.   BPH Resume home finasteride   Chronic back pain Avoid NSAIDs in house due to AKI.   Type 2 diabetes Diet controlled   PPX:  - on  lovenox      Consultants: None Disposition: Home Diet recommendation:  Discharge Diet Orders (From admission, onward)     Start     Ordered   01/20/24 0000  Diet - low sodium heart healthy        01/20/24 1206           Regular diet DISCHARGE MEDICATION: Allergies as of 01/20/2024       Reactions   Amlodipine Other (See Comments)   Edema        Medication List     STOP taking these medications    cyclobenzaprine 10 MG tablet Commonly known as: FLEXERIL   hydrocortisone 25 MG suppository Commonly known as: ANUSOL-HC   methylPREDNISolone 4 MG Tbpk tablet Commonly known as: MEDROL DOSEPAK   olmesartan-hydrochlorothiazide 20-12.5 MG tablet Commonly known as: BENICAR HCT       TAKE these medications    finasteride 5 MG tablet Commonly known as: PROSCAR Take 5 mg by mouth daily. Reported on 11/16/2015   ibuprofen 800 MG tablet Commonly known as: ADVIL Take 1 tablet (800 mg total) by mouth every 8 (eight) hours as needed.   metFORMIN 500 MG tablet Commonly known as: GLUCOPHAGE Take 0.5 tablets (250 mg total) by mouth 2 (two) times daily with a meal.   Muscle Rub 10-15 % Crea Apply 1 Application topically as needed for muscle pain.   olmesartan 20 MG tablet Commonly known as: BENICAR Take 1 tablet (20 mg total) by mouth daily.        Discharge Exam: Filed Weights   01/19/24 0400  Weight: 58.6 kg   General: 73 y.o. year-old male well developed well nourished in no acute distress.  Alert and oriented x3. Cardiovascular: Regular rate and rhythm with no rubs or gallops.  Respiratory: Clear to auscultation with no wheezes or rales. Good inspiratory effort. Abdomen: Soft nontender nondistended with normal bowel sounds x4 quadrants. Muskuloskeletal: No cyanosis, clubbing or edema noted bilaterally Neuro: CN II-XII intact, strength, sensation, reflexes Skin: No ulcerative lesions noted or rashes Psychiatry: Judgement and insight appear normal. Mood  is appropriate for condition and setting  Condition at discharge: good  The results of significant diagnostics from this hospitalization (including imaging, microbiology, ancillary and laboratory) are listed below for reference.   Imaging Studies: ECHOCARDIOGRAM COMPLETE Result Date: 01/19/2024    ECHOCARDIOGRAM REPORT   Patient Name:   Ronald Davenport BJYN Date of Exam: 01/19/2024 Medical Rec #:  [id number]       Height:       68.0 in Accession #:    [id number]      Weight:       129.2 lb Date of Birth:  1951-06-05        BSA:          1.697 m Patient Age:    72 years        BP:           114/80 mmHg Patient Gender: M  HR:           57 bpm. Exam Location:  Inpatient Procedure: 2D Echo, Cardiac Doppler and Color Doppler (Both Spectral and Color            Flow Doppler were utilized during procedure). Indications:    Syncope R55  History:        Patient has no prior history of Echocardiogram examinations.                 Risk Factors:Diabetes and Hypertension.  Sonographer:    Webb Laws Referring Phys: [id number] CAROLE N HALL IMPRESSIONS  1. Left ventricular ejection fraction, by estimation, is 60 to 65%. The left ventricle has normal function. The left ventricle has no regional wall motion abnormalities. There is mild left ventricular hypertrophy. Left ventricular diastolic parameters were normal.  2. Right ventricular systolic function is normal. The right ventricular size is normal.  3. The mitral valve is normal in structure. No evidence of mitral valve regurgitation. No evidence of mitral stenosis.  4. The aortic valve is tricuspid. There is mild calcification of the aortic valve. Aortic valve regurgitation is not visualized. Aortic valve sclerosis is present, with no evidence of aortic valve stenosis.  5. The inferior vena cava is normal in size with greater than 50% respiratory variability, suggesting right atrial pressure of 3 mmHg. FINDINGS  Left Ventricle: Left ventricular ejection  fraction, by estimation, is 60 to 65%. The left ventricle has normal function. The left ventricle has no regional wall motion abnormalities. Strain imaging was not performed. The left ventricular internal cavity  size was normal in size. There is mild left ventricular hypertrophy. Left ventricular diastolic parameters were normal. Right Ventricle: The right ventricular size is normal. No increase in right ventricular wall thickness. Right ventricular systolic function is normal. Left Atrium: Left atrial size was normal in size. Right Atrium: Right atrial size was normal in size. Pericardium: There is no evidence of pericardial effusion. Mitral Valve: The mitral valve is normal in structure. No evidence of mitral valve regurgitation. No evidence of mitral valve stenosis. Tricuspid Valve: The tricuspid valve is normal in structure. Tricuspid valve regurgitation is trivial. No evidence of tricuspid stenosis. Aortic Valve: The aortic valve is tricuspid. There is mild calcification of the aortic valve. Aortic valve regurgitation is not visualized. Aortic valve sclerosis is present, with no evidence of aortic valve stenosis. Pulmonic Valve: The pulmonic valve was normal in structure. Pulmonic valve regurgitation is not visualized. No evidence of pulmonic stenosis. Aorta: The aortic root is normal in size and structure. Venous: The inferior vena cava is normal in size with greater than 50% respiratory variability, suggesting right atrial pressure of 3 mmHg. IAS/Shunts: No atrial level shunt detected by color flow Doppler. Additional Comments: 3D imaging was not performed.  LEFT VENTRICLE PLAX 2D LVIDd:         3.90 cm   Diastology LVIDs:         2.10 cm   LV e' medial:    5.87 cm/s LV PW:         0.70 cm   LV E/e' medial:  12.5 LV IVS:        1.20 cm   LV e' lateral:   11.00 cm/s LVOT diam:     1.70 cm   LV E/e' lateral: 6.6 LV SV:         54 LV SV Index:   32 LVOT Area:     2.27 cm  RIGHT VENTRICLE  IVC RV  Basal diam:  2.50 cm     IVC diam: 0.90 cm RV S prime:     10.40 cm/s TAPSE (M-mode): 2.4 cm LEFT ATRIUM             Index        RIGHT ATRIUM          Index LA diam:        2.80 cm 1.65 cm/m   RA Area:     8.88 cm LA Vol (A2C):   21.2 ml 12.49 ml/m  RA Volume:   14.40 ml 8.48 ml/m LA Vol (A4C):   22.4 ml 13.20 ml/m LA Biplane Vol: 21.8 ml 12.84 ml/m  AORTIC VALVE LVOT Vmax:   119.00 cm/s LVOT Vmean:  73.300 cm/s LVOT VTI:    0.239 m  AORTA Ao Root diam: 3.10 cm Ao Asc diam:  2.70 cm MITRAL VALVE MV Area (PHT): 2.87 cm    SHUNTS MV Decel Time: 264 msec    Systemic VTI:  0.24 m MV E velocity: 73.10 cm/s  Systemic Diam: 1.70 cm MV A velocity: 98.10 cm/s MV E/A ratio:  0.75 Charlton Haws MD Electronically signed by Charlton Haws MD Signature Date/Time: 01/19/2024/3:24:58 PM    Final    DG Chest Portable 1 View Result Date: 01/19/2024 CLINICAL DATA:  Syncope EXAM: PORTABLE CHEST 1 VIEW COMPARISON:  Chest CT 03/20/2016 FINDINGS: There is right apical pleural thickening similar to prior. The lungs are otherwise clear. There is no pleural effusion or pneumothorax. The cardiomediastinal silhouette is within normal limits. No acute fractures are seen. IMPRESSION: No active disease. Electronically Signed   By: Darliss Cheney M.D.   On: 01/19/2024 01:35    Microbiology: Results for orders placed or performed during the hospital encounter of 01/19/24  Resp panel by RT-PCR (RSV, Flu A&B, Covid) Anterior Nasal Swab     Status: None   Collection Time: 01/19/24  1:13 AM   Specimen: Anterior Nasal Swab  Result Value Ref Range Status   SARS Coronavirus 2 by RT PCR NEGATIVE NEGATIVE Final    Comment: (NOTE) SARS-CoV-2 target nucleic acids are NOT DETECTED.  The SARS-CoV-2 RNA is generally detectable in upper respiratory specimens during the acute phase of infection. The lowest concentration of SARS-CoV-2 viral copies this assay can detect is 138 copies/mL. A negative result does not preclude SARS-Cov-2 infection  and should not be used as the sole basis for treatment or other patient management decisions. A negative result may occur with  improper specimen collection/handling, submission of specimen other than nasopharyngeal swab, presence of viral mutation(s) within the areas targeted by this assay, and inadequate number of viral copies(<138 copies/mL). A negative result must be combined with clinical observations, patient history, and epidemiological information. The expected result is Negative.  Fact Sheet for Patients:  BloggerCourse.com  Fact Sheet for Healthcare Providers:  SeriousBroker.it  This test is no t yet approved or cleared by the [place] FDA and  has been authorized for detection and/or diagnosis of SARS-CoV-2 by FDA under an Emergency Use Authorization (EUA). This EUA will remain  in effect (meaning this test can be used) for the duration of the COVID-19 declaration under Section 564(b)(1) of the Act, 21 U.S.C.section 360bbb-3(b)(1), unless the authorization is terminated  or revoked sooner.       Influenza A by PCR NEGATIVE NEGATIVE Final   Influenza B by PCR NEGATIVE NEGATIVE Final    Comment: (NOTE) The Xpert Xpress SARS-CoV-2/FLU/RSV plus assay is  intended as an aid in the diagnosis of influenza from Nasopharyngeal swab specimens and should not be used as a sole basis for treatment. Nasal washings and aspirates are unacceptable for Xpert Xpress SARS-CoV-2/FLU/RSV testing.  Fact Sheet for Patients: BloggerCourse.com  Fact Sheet for Healthcare Providers: SeriousBroker.it  This test is not yet approved or cleared by the [place] FDA and has been authorized for detection and/or diagnosis of SARS-CoV-2 by FDA under an Emergency Use Authorization (EUA). This EUA will remain in effect (meaning this test can be used) for the duration of the COVID-19 declaration  under Section 564(b)(1) of the Act, 21 U.S.C. section 360bbb-3(b)(1), unless the authorization is terminated or revoked.     Resp Syncytial Virus by PCR NEGATIVE NEGATIVE Final    Comment: (NOTE) Fact Sheet for Patients: BloggerCourse.com  Fact Sheet for Healthcare Providers: SeriousBroker.it  This test is not yet approved or cleared by the [place] FDA and has been authorized for detection and/or diagnosis of SARS-CoV-2 by FDA under an Emergency Use Authorization (EUA). This EUA will remain in effect (meaning this test can be used) for the duration of the COVID-19 declaration under Section 564(b)(1) of the Act, 21 U.S.C. section 360bbb-3(b)(1), unless the authorization is terminated or revoked.  Performed at [facility], [address] [place], [place] [address]     Labs: CBC: Recent Labs  Lab 01/19/24 0113 01/20/24 0553  WBC 4.4 4.0  NEUTROABS 2.0  --   HGB 13.4 13.2  HCT 41.7 41.3  MCV 95.6 95.8  PLT 208 193   Basic Metabolic Panel: Recent Labs  Lab 01/19/24 0250 01/20/24 0553  NA 139 137  K 3.7 4.3  CL 103 102  CO2 27 29  GLUCOSE 146* 90  BUN 28* 15  CREATININE 1.39* 0.94  CALCIUM 8.6* 8.9  MG 2.7*  --    Liver Function Tests: No results for input(s): "AST", "ALT", "ALKPHOS", "BILITOT", "PROT", "ALBUMIN" in the last 168 hours. CBG: No results for input(s): "GLUCAP" in the last 168 hours.  Discharge time spent: less than 30 minutes.  Signed: Tobey Grim, MD [facility] 01/20/2024

## 2024-01-20 NOTE — Plan of Care (Signed)

## 2024-01-20 NOTE — Plan of Care (Signed)

## 2024-01-20 NOTE — TOC Initial Note (Signed)
 Transition of Care [facility]) - Initial/Assessment Note    Patient Details  Name: Ronald Davenport MRN: [id number] Date of Birth: [birth year]  Transition of Care [facility]) CM/SW Contact:    Adrian Prows, RN Phone Number: 01/20/2024, 9:44 AM  Clinical Narrative:                 Sherron Monday w/ pt in room; pt says he lives at home w/ his sister; he plans to return at d/c; he identified POC brother Yvonne Kendall ([phone]); pt says family will provide transportation; he verified insurance/PCP; pt says he has difficulty paying his utilities and housing; pt says he does not have DME, HH services, or home oxygen; he agrees to receive resources for financial assistance, and social services; resources placed in d/c instructions; copies of resources also given to pt; he will make his appts at agencies of choice; also this RN, CM verbally explained MOON; pt verbalized understanding, and signed document; copy of MOON given to pt; no TOC needs; please place consult if needed.  Expected Discharge Plan: Home/Self Care Barriers to Discharge: Continued Medical Work up   Patient Goals and CMS Choice Patient states their goals for this hospitalization and ongoing recovery are:: home CMS Medicare.gov Compare Post Acute Care list provided to:: Patient        Expected Discharge Plan and Services   Discharge Planning Services: CM Consult   Living arrangements for the past 2 months: Single Family Home                                      Prior Living Arrangements/Services Living arrangements for the past 2 months: Single Family Home Lives with:: Relatives Patient language and need for interpreter reviewed:: Yes Do you feel safe going back to the place where you live?: Yes      Need for Family Participation in Patient Care: Yes (Comment) Care giver support system in place?: Yes (comment) Current home services:  (n/a) Criminal Activity/Legal Involvement Pertinent to Current  Situation/Hospitalization: No - Comment as needed  Activities of Daily Living   ADL Screening (condition at time of admission) Independently performs ADLs?: Yes (appropriate for developmental age) Is the patient deaf or have difficulty hearing?: No Does the patient have difficulty seeing, even when wearing glasses/contacts?: No Does the patient have difficulty concentrating, remembering, or making decisions?: No  Permission Sought/Granted Permission sought to share information with : Case Manager Permission granted to share information with : Yes, Verbal Permission Granted  Share Information with NAME: Case Manager     Permission granted to share info w Relationship: Yvonne Kendall (brother) [phone]     Emotional Assessment Appearance:: Appears stated age Attitude/Demeanor/Rapport: Gracious Affect (typically observed): Accepting Orientation: : Oriented to Self, Oriented to Place, Oriented to  Time, Oriented to Situation Alcohol / Substance Use: Not Applicable Psych Involvement: No (comment)  Admission diagnosis:  Syncope and collapse [R55] Dehydration [E86.0] Syncope [R55] Patient Active Problem List   Diagnosis Date Noted   Syncope 01/19/2024   Trigger finger, left middle finger 09/28/2023   Bilateral lower extremity edema 09/22/2023   Primary hypertension 08/29/2023   Prediabetes 09/06/2022   Bilateral knee pain 09/05/2022   Hemorrhoids 09/05/2022   History of colon polyps 09/05/2022   Chronic left-sided low back pain with left-sided sciatica 01/05/2017   Adrenal adenoma 11/16/2015   GERD (gastroesophageal reflux disease) 11/28/2014   Chronic back pain 11/27/2014  BPH (benign prostatic hyperplasia) 09/07/2014   Elevated PSA 02/12/2013   PCP:  Loyola Mast, MD Pharmacy:   [facility] [address], [place] - [address] [place] [address] Phone: [phone] Fax: [phone]  [id number] [place], [place] - [address] AT [facility] & [address] [place] [address] Phone: [phone] Fax: [phone]  [id number] - [place], [place] - [address] [place] [address] Phone: [phone] Fax: [phone]     Social Drivers of Health (SDOH) Social History: SDOH Screenings   Food Insecurity: No Food Insecurity (01/20/2024)  Housing: High Risk (01/20/2024)  Transportation Needs: No Transportation Needs (01/20/2024)  Utilities: At Risk (01/20/2024)  Depression (PHQ2-9): Low Risk  (10/31/2022)  Financial Resource Strain: Low Risk  (10/31/2022)  Physical Activity: Inactive (10/31/2022)  Stress: No Stress Concern Present (10/31/2022)  Tobacco Use: Low Risk  (09/28/2023)   SDOH Interventions: Food Insecurity Interventions: Intervention Not Indicated, Inpatient TOC Housing Interventions: Community Resources Provided, Inpatient TOC Transportation Interventions: Intervention Not Indicated, Inpatient TOC Utilities Interventions: [employer] Provided, Inpatient TOC   Readmission Risk Interventions     No data to display

## 2024-01-20 NOTE — Care Management Obs Status (Signed)
 MEDICARE OBSERVATION STATUS NOTIFICATION   Patient Details  Name: Ronald Davenport MRN: [id number] Date of Birth: 1951-06-22   Medicare Observation Status Notification Given:  Yes    Adrian Prows, RN 01/20/2024, 9:40 AM

## 2024-01-23 ENCOUNTER — Emergency Department (HOSPITAL_COMMUNITY)
Admission: EM | Admit: 2024-01-23 | Discharge: 2024-01-23 | Disposition: A | Discharge status: Home / Self Care | Payer: Medicare Other | Attending: Emergency Medicine | Admitting: Emergency Medicine

## 2024-01-23 ENCOUNTER — Encounter (HOSPITAL_COMMUNITY): Payer: Self-pay

## 2024-01-23 DIAGNOSIS — T50901A Poisoning by unspecified drugs, medicaments and biological substances, accidental (unintentional), initial encounter: Secondary | ICD-10-CM

## 2024-01-23 DIAGNOSIS — T465X1A Poisoning by other antihypertensive drugs, accidental (unintentional), initial encounter: Secondary | ICD-10-CM | POA: Insufficient documentation

## 2024-01-23 NOTE — Discharge Instructions (Signed)
 Your blood pressure is reassuring.  I recommend resuming losartan dosage starting Thursday.  If you develop any life-threatening symptoms return to the emergency department.

## 2024-01-23 NOTE — ED Provider Notes (Signed)
 [place] EMERGENCY DEPARTMENT AT [facility] Provider Note   CSN: [id number] Arrival date & time: 01/23/24  0435     History  Chief Complaint  Patient presents with   Drug Overdose    Ronald Davenport is a 73 y.o. male.  Patient presents to the emergency department concerned that he took his olmesartan dose early.  He began taking the medication on Monday and has been taking the medication at 1 PM.  He woke up this morning thinking he was taking his finasteride and accidentally took his olmesartan at 3 AM.  He denies any symptoms at this time.   Drug Overdose       Home Medications Prior to Admission medications   Medication Sig Start Date End Date Taking? Authorizing Provider  finasteride (PROSCAR) 5 MG tablet Take 5 mg by mouth daily. Reported on 11/16/2015    [provider]  ibuprofen (ADVIL,MOTRIN) 800 MG tablet Take 1 tablet (800 mg total) by mouth every 8 (eight) hours as needed. 05/29/17   Garnetta Buddy, PA  Menthol-Methyl Salicylate (MUSCLE RUB) 10-15 % CREA Apply 1 Application topically as needed for muscle pain. 01/20/24   Tobey Grim, MD  metFORMIN (GLUCOPHAGE) 500 MG tablet Take 0.5 tablets (250 mg total) by mouth 2 (two) times daily with a meal. Patient not taking: Reported on 01/19/2024 08/30/23   Nche, Bonna Gains, NP  olmesartan (BENICAR) 20 MG tablet Take 1 tablet (20 mg total) by mouth daily. 01/20/24   Tobey Grim, MD      Allergies    Amlodipine    Review of Systems   Review of Systems  Physical Exam Updated Vital Signs BP (!) 167/78 (BP Location: Left Arm)   Pulse 69   Temp 97.9 F (36.6 C) (Oral)   Resp 18   SpO2 100%  Physical Exam Vitals and nursing note reviewed.  HENT:     Head: Normocephalic and atraumatic.  Eyes:     Pupils: Pupils are equal, round, and reactive to light.  Pulmonary:     Effort: Pulmonary effort is normal. No respiratory distress.  Musculoskeletal:        General: No signs of  injury.     Cervical back: Normal range of motion.  Skin:    General: Skin is dry.  Neurological:     Mental Status: He is alert.  Psychiatric:        Speech: Speech normal.        Behavior: Behavior normal.     ED Results / Procedures / Treatments   Labs (all labs ordered are listed, but only abnormal results are displayed) Labs Reviewed - No data to display  EKG None  Radiology No results found.  Procedures Procedures    Medications Ordered in ED Medications - No data to display  ED Course/ Medical Decision Making/ A&P                                 Medical Decision Making  Patient presents to the emergency department with concern about accidental medication ingestion.  The patient took his prescribed losartan but took it approximately 12 hours early.  Upon arrival his blood pressure is within normal limits if not slightly elevated and the patient is asymptomatic.  There is no indication at this time for emergent workup including lab work or imaging.  Patient is stable for discharge home.  Patient will  resume his prescription on Thursday.          Final Clinical Impression(s) / ED Diagnoses Final diagnoses:  Accidental medication error, initial encounter    Rx / DC Orders ED Discharge Orders     None         Pamala Duffel 01/23/24 9147    Nira Conn, MD 01/23/24 [phone]

## 2024-01-23 NOTE — ED Triage Notes (Signed)
 Pt states that he just started Olmesartan 20mg  on Monday, took yesterday afternoon at 1pm and accidentally took it again this morning at 3am. Pt afraid since he took it to soon.

## 2024-01-24 ENCOUNTER — Encounter: Payer: Self-pay | Admitting: Family Medicine

## 2024-01-24 ENCOUNTER — Ambulatory Visit (INDEPENDENT_AMBULATORY_CARE_PROVIDER_SITE_OTHER): Payer: Medicare Other | Admitting: Family Medicine

## 2024-01-24 VITALS — BP 126/72 | HR 60 | Temp 98.4°F | Ht 68.0 in | Wt 137.4 lb

## 2024-01-24 DIAGNOSIS — G8929 Other chronic pain: Secondary | ICD-10-CM | POA: Diagnosis not present

## 2024-01-24 DIAGNOSIS — M5442 Lumbago with sciatica, left side: Secondary | ICD-10-CM | POA: Diagnosis not present

## 2024-01-24 DIAGNOSIS — R7303 Prediabetes: Secondary | ICD-10-CM | POA: Diagnosis not present

## 2024-01-24 DIAGNOSIS — I10 Essential (primary) hypertension: Secondary | ICD-10-CM

## 2024-01-24 DIAGNOSIS — R55 Syncope and collapse: Secondary | ICD-10-CM

## 2024-01-24 LAB — BASIC METABOLIC PANEL
BUN: 21 mg/dL (ref 6–23)
CO2: 31 meq/L (ref 19–32)
Calcium: 8.7 mg/dL (ref 8.4–10.5)
Chloride: 106 meq/L (ref 96–112)
Creatinine, Ser: 1.27 mg/dL (ref 0.40–1.50)
GFR: 56.78 mL/min — ABNORMAL LOW (ref >60.00)
Glucose, Bld: 96 mg/dL (ref 70–99)
Potassium: 4.3 meq/L (ref 3.5–5.1)
Sodium: 141 meq/L (ref 135–145)

## 2024-01-24 LAB — HEMOGLOBIN A1C: Hgb A1c MFr Bld: 6.2 % (ref 4.6–6.5)

## 2024-01-24 MED ORDER — OLMESARTAN MEDOXOMIL 20 MG PO TABS: 20.0000 mg | ORAL_TABLET | Freq: Every day | ORAL | 3 refills | Status: AC

## 2024-01-24 MED ORDER — MUSCLE RUB 10-15 % EX CREA: 1.0000 | TOPICAL_CREAM | CUTANEOUS | 0 refills | Status: DC | PRN

## 2024-01-24 NOTE — Progress Notes (Signed)
 [facility] PRIMARY CARE [facility] PRIMARY [facility] [address] [place] [address] Dept: [phone] Dept Fax: [phone]  Hospital Follow-up Visit  Subjective:    Patient ID: Ronald Davenport, male    DOB: Apr 06, 1951, 73 y.o..   MRN: [id number]  Chief Complaint  Patient presents with   Hypertension    2 month fu HTN.    History of Present Illness:  Patient is in today for follow-up from a recent hospitalization. Ronald Davenport was admitted at [facility] from 2/22-2/23/2025 having experienced an episode of syncope. He describes a number of stressors related to a court case over his wife's estate. He notes he was fasting and praying frequently. He thinks he may have become dehydrated. At presentation, he apparently had a low blood pressure. He was discharged with his BP medicine being switched from Benicar HCT to plain olmesartan. He has been feeling well since then.  Past Medical History: Patient Active Problem List   Diagnosis Date Noted   Syncope 01/19/2024   Trigger finger, left middle finger 09/28/2023   Bilateral lower extremity edema 09/22/2023   Primary hypertension 08/29/2023   Prediabetes 09/06/2022   Bilateral knee pain 09/05/2022   Hemorrhoids 09/05/2022   History of colon polyps 09/05/2022   Chronic left-sided low back pain with left-sided sciatica 01/05/2017   Adrenal adenoma 11/16/2015   GERD (gastroesophageal reflux disease) 11/28/2014   Chronic back pain 11/27/2014   BPH (benign prostatic hyperplasia) 09/07/2014   Elevated PSA 02/12/2013   Past Surgical History:  Procedure Laterality Date   CATARACT EXTRACTION Left 11/27/2004   PROSTATE BIOPSY     negative in 2011   ROTATOR CUFF REPAIR Right 11/27/2004   Family History  Problem Relation Age of Onset   Stroke Mother    Diabetes Sister    Colon polyps Brother    Colon cancer Brother    Heart disease Brother    Esophageal cancer Neg Hx    Rectal cancer Neg Hx    Stomach cancer Neg Hx     Outpatient Medications Prior to Visit  Medication Sig Dispense Refill   finasteride (PROSCAR) 5 MG tablet Take 5 mg by mouth daily. Reported on 11/16/2015     ibuprofen (ADVIL,MOTRIN) 800 MG tablet Take 1 tablet (800 mg total) by mouth every 8 (eight) hours as needed. 30 tablet 0   metFORMIN (GLUCOPHAGE) 500 MG tablet Take 0.5 tablets (250 mg total) by mouth 2 (two) times daily with a meal. 30 tablet 5   Menthol-Methyl Salicylate (MUSCLE RUB) 10-15 % CREA Apply 1 Application topically as needed for muscle pain. 15 g 0   olmesartan (BENICAR) 20 MG tablet Take 1 tablet (20 mg total) by mouth daily. 30 tablet 0   No facility-administered medications prior to visit.   Allergies  Allergen Reactions   Amlodipine Other (See Comments)    Edema     Objective:   Today's Vitals   01/24/24 1111  BP: 126/72  Pulse: 60  Temp: 98.4 F (36.9 C)  TempSrc: Temporal  SpO2: 97%  Weight: 137 lb 6.4 oz (62.3 kg)  Height: 5\' 8"  (1.727 m)   Body mass index is 20.89 kg/m.   General: Well developed, well nourished. No acute distress. Psych: Alert and oriented. Normal mood and affect.  Health Maintenance Due  Topic Date Due   DTaP/Tdap/Td (1 - Tdap) Never done   Zoster Vaccines- Shingrix (1 of 2) Never done   Medicare Annual Wellness (AWV)  11/01/2023   Lab Results  Latest Ref Rng & Units 01/20/2024    5:53 AM 01/19/2024    2:50 AM 09/21/2023    3:16 PM  CMP  Glucose 70 - 99 mg/dL 90  161  84   BUN 8 - 23 mg/dL 15  28  16    Creatinine 0.61 - 1.24 mg/dL 0.96  0.45  4.09   Sodium 135 - 145 mmol/L 137  139  140   Potassium 3.5 - 5.1 mmol/L 4.3  3.7  4.0   Chloride 98 - 111 mmol/L 102  103  100   CO2 22 - 32 mmol/L 29  27  25    Calcium 8.9 - 10.3 mg/dL 8.9  8.6  8.9   Total Protein 6.0 - 8.5 g/dL   6.2   Total Bilirubin 0.0 - 1.2 mg/dL   0.6   Alkaline Phos 44 - 121 IU/L   140   AST 0 - 40 IU/L   24   ALT 0 - 44 IU/L   32      Assessment & Plan:   Problem List Items Addressed This  Visit       Cardiovascular and Mediastinum   Primary hypertension - Primary   Blood pressure is normal today. Continue olmesartan 20 mg daily. I suspect the reduciton in stressors has reduced his overall BP and his improved hydration is prevent hypotension.      Relevant Medications   olmesartan (BENICAR) 20 MG tablet   Other Relevant Orders   Basic metabolic panel     Nervous and Auditory   Chronic left-sided low back pain with left-sided sciatica   I will renew his back rub.      Relevant Medications   Menthol-Methyl Salicylate (MUSCLE RUB) 10-15 % CREA     Other   Prediabetes   I will reassess the A1c and blood sugar today.      Relevant Orders   Basic metabolic panel   Hemoglobin A1c   Syncope   Resolved. Likely to have been secondary to dehydration.      Relevant Orders   Basic metabolic panel    Return in about 3 months (around 04/22/2024) for Reassessment.   Loyola Mast, MD

## 2024-01-24 NOTE — Assessment & Plan Note (Signed)
 I will reassess the A1c and blood sugar today.

## 2024-01-24 NOTE — Assessment & Plan Note (Signed)
 I will renew his back rub.

## 2024-01-24 NOTE — Assessment & Plan Note (Signed)
 Resolved. Likely to have been secondary to dehydration.

## 2024-01-24 NOTE — Assessment & Plan Note (Signed)
 Blood pressure is normal today. Continue olmesartan 20 mg daily. I suspect the reduciton in stressors has reduced his overall BP and his improved hydration is prevent hypotension.

## 2024-01-25 ENCOUNTER — Telehealth: Payer: Self-pay

## 2024-01-25 NOTE — Telephone Encounter (Signed)
 Copied from CRM [id number]. Topic: Clinical - Lab/Test Results >> Jan 25, 2024 12:15 PM Chantha C wrote: Reason for CRM: Patient informed labs results 01/25/24. Patient verified understanding and denies any other concerns. >> Jan 25, 2024 12:17 PM Chantha C wrote: Ronald Davenport, results have been relayed.  Noted. Dm/cma

## 2024-01-30 DIAGNOSIS — H25811 Combined forms of age-related cataract, right eye: Secondary | ICD-10-CM | POA: Diagnosis not present

## 2024-01-30 DIAGNOSIS — H43813 Vitreous degeneration, bilateral: Secondary | ICD-10-CM | POA: Diagnosis not present

## 2024-02-08 DIAGNOSIS — M5117 Intervertebral disc disorders with radiculopathy, lumbosacral region: Secondary | ICD-10-CM | POA: Diagnosis not present

## 2024-03-03 ENCOUNTER — Ambulatory Visit (INDEPENDENT_AMBULATORY_CARE_PROVIDER_SITE_OTHER): Admitting: Family Medicine

## 2024-03-03 ENCOUNTER — Encounter: Payer: Self-pay | Admitting: Family Medicine

## 2024-03-03 VITALS — BP 124/66 | HR 70 | Temp 98.2°F | Ht 68.0 in | Wt 134.0 lb

## 2024-03-03 DIAGNOSIS — J069 Acute upper respiratory infection, unspecified: Secondary | ICD-10-CM | POA: Insufficient documentation

## 2024-03-03 MED ORDER — HYDROCODONE BIT-HOMATROP MBR 5-1.5 MG/5ML PO SOLN: 5.0000 mL | Freq: Three times a day (TID) | ORAL | 0 refills | Status: DC | PRN

## 2024-03-03 NOTE — Assessment & Plan Note (Signed)
 Discussed home care for viral illness, including rest, pushing fluids, and OTC medications as needed for symptom relief. I will prescribed some cough syrup for nighttime use. Recommend hot tea with honey for sore throat symptoms. Follow-up if needed for worsening or persistent symptoms.

## 2024-03-03 NOTE — Progress Notes (Signed)
 [facility] PRIMARY CARE [facility] [place] [address] [place] [address] Dept: [phone] Dept Fax: [phone]  Office Visit  Subjective:    Patient ID: Ronald Davenport, male    DOB: August 29, 1951, 73 y.o..   MRN: [id number]  Chief Complaint  Patient presents with   Cough   History of Present Illness:  Patient is in today for with a 5-day history of cough and chest congestion. He felt chilled last Friday, but notes this has improved. He has also had some associated GI upset, though no vomiting or diarrhea. He is using a cough syrup from [place] (has menthol, eucalyptus, peppermint oil, etc.) and some Robitussin DM.  Past Medical History: Patient Active Problem List   Diagnosis Date Noted   Syncope 01/19/2024   Trigger finger, left middle finger 09/28/2023   Bilateral lower extremity edema 09/22/2023   Primary hypertension 08/29/2023   Prediabetes 09/06/2022   Bilateral knee pain 09/05/2022   Hemorrhoids 09/05/2022   History of colon polyps 09/05/2022   Chronic left-sided low back pain with left-sided sciatica 01/05/2017   Adrenal adenoma 11/16/2015   GERD (gastroesophageal reflux disease) 11/28/2014   Chronic back pain 11/27/2014   BPH (benign prostatic hyperplasia) 09/07/2014   Elevated PSA 02/12/2013   Past Surgical History:  Procedure Laterality Date   CATARACT EXTRACTION Left 11/27/2004   PROSTATE BIOPSY     negative in 2011   ROTATOR CUFF REPAIR Right 11/27/2004   Family History  Problem Relation Age of Onset   Stroke Mother    Diabetes Sister    Colon polyps Brother    Colon cancer Brother    Heart disease Brother    Esophageal cancer Neg Hx    Rectal cancer Neg Hx    Stomach cancer Neg Hx    Outpatient Medications Prior to Visit  Medication Sig Dispense Refill   finasteride (PROSCAR) 5 MG tablet Take 5 mg by mouth daily. Reported on 11/16/2015     ibuprofen (ADVIL,MOTRIN) 800 MG tablet Take 1 tablet (800 mg total) by mouth  every 8 (eight) hours as needed. 30 tablet 0   Menthol-Methyl Salicylate (MUSCLE RUB) 10-15 % CREA Apply 1 Application topically as needed for muscle pain. 15 g 0   metFORMIN (GLUCOPHAGE) 500 MG tablet Take 0.5 tablets (250 mg total) by mouth 2 (two) times daily with a meal. 30 tablet 5   olmesartan (BENICAR) 20 MG tablet Take 1 tablet (20 mg total) by mouth daily. 90 tablet 3   No facility-administered medications prior to visit.   Allergies  Allergen Reactions   Amlodipine Other (See Comments)    Edema     Objective:   Today's Vitals   03/03/24 1118  BP: 124/66  Pulse: 70  Temp: 98.2 F (36.8 C)  TempSrc: Temporal  SpO2: 98%  Weight: 134 lb (60.8 kg)  Height: 5\' 8"  (1.727 m)   Body mass index is 20.37 kg/m.   General: Well developed, well nourished. No acute distress. HEENT: Normocephalic, non-traumatic. PERRL, EOMI. Conjunctiva clear. External ears normal. EAC and   TMs normal bilaterally. Nose clear without congestion or rhinorrhea. Mucous membranes moist.   Oropharynx clear. Good dentition. Neck: Supple. No lymphadenopathy. No thyromegaly. Lungs: Clear to auscultation bilaterally. No wheezing, rales or rhonchi. Psych: Alert and oriented. Normal mood and affect.  Health Maintenance Due  Topic Date Due   DTaP/Tdap/Td (1 - Tdap) Never done   Zoster Vaccines- Shingrix (1 of 2) Never done   [employer] Wellness (AWV)  11/01/2023     Assessment & Plan:   Problem List Items Addressed This Visit       Respiratory   Viral URI with cough - Primary   Discussed home care for viral illness, including rest, pushing fluids, and OTC medications as needed for symptom relief. I will prescribed some cough syrup for nighttime use. Recommend hot tea with honey for sore throat symptoms. Follow-up if needed for worsening or persistent symptoms.        Return in about 4 months (around 07/03/2024) for Follow-up as scheduled.   Loyola Mast, MD

## 2024-04-02 DIAGNOSIS — H43813 Vitreous degeneration, bilateral: Secondary | ICD-10-CM | POA: Diagnosis not present

## 2024-04-02 DIAGNOSIS — H25811 Combined forms of age-related cataract, right eye: Secondary | ICD-10-CM | POA: Diagnosis not present

## 2024-04-22 ENCOUNTER — Ambulatory Visit: Payer: Medicare Other | Admitting: Family Medicine

## 2024-04-23 ENCOUNTER — Ambulatory Visit (INDEPENDENT_AMBULATORY_CARE_PROVIDER_SITE_OTHER): Admitting: Family Medicine

## 2024-04-23 ENCOUNTER — Ambulatory Visit: Payer: Self-pay | Admitting: Family Medicine

## 2024-04-23 ENCOUNTER — Encounter: Payer: Self-pay | Admitting: Family Medicine

## 2024-04-23 VITALS — BP 142/80 | HR 59 | Temp 97.4°F | Ht 68.0 in | Wt 138.0 lb

## 2024-04-23 DIAGNOSIS — G8929 Other chronic pain: Secondary | ICD-10-CM

## 2024-04-23 DIAGNOSIS — R972 Elevated prostate specific antigen [PSA]: Secondary | ICD-10-CM | POA: Diagnosis not present

## 2024-04-23 DIAGNOSIS — R7303 Prediabetes: Secondary | ICD-10-CM

## 2024-04-23 DIAGNOSIS — M5442 Lumbago with sciatica, left side: Secondary | ICD-10-CM

## 2024-04-23 DIAGNOSIS — R600 Localized edema: Secondary | ICD-10-CM | POA: Diagnosis not present

## 2024-04-23 DIAGNOSIS — G4762 Sleep related leg cramps: Secondary | ICD-10-CM | POA: Diagnosis not present

## 2024-04-23 DIAGNOSIS — I10 Essential (primary) hypertension: Secondary | ICD-10-CM | POA: Diagnosis not present

## 2024-04-23 LAB — BASIC METABOLIC PANEL WITH GFR
BUN: 19 mg/dL (ref 6–23)
CO2: 31 meq/L (ref 19–32)
Calcium: 9.4 mg/dL (ref 8.4–10.5)
Chloride: 102 meq/L (ref 96–112)
Creatinine, Ser: 1.22 mg/dL (ref 0.40–1.50)
GFR: 59.48 mL/min — ABNORMAL LOW (ref >60.00)
Glucose, Bld: 93 mg/dL (ref 70–99)
Potassium: 4.4 meq/L (ref 3.5–5.1)
Sodium: 139 meq/L (ref 135–145)

## 2024-04-23 LAB — PSA: PSA: 4.56 ng/mL — ABNORMAL HIGH (ref 0.10–4.00)

## 2024-04-23 LAB — MAGNESIUM: Magnesium: 2 mg/dL (ref 1.5–2.5)

## 2024-04-23 LAB — HEMOGLOBIN A1C: Hgb A1c MFr Bld: 5.9 % (ref 4.6–6.5)

## 2024-04-23 MED ORDER — MUSCLE RUB 10-15 % EX CREA: 1.0000 | TOPICAL_CREAM | CUTANEOUS | 5 refills | Status: DC | PRN

## 2024-04-23 NOTE — Assessment & Plan Note (Signed)
 I will renew his back rub.

## 2024-04-23 NOTE — Assessment & Plan Note (Signed)
 Blood pressure is elevated today, though Ronald Davenport has been off of his medication. I recommend he resume olmesartan  20 mg daily.

## 2024-04-23 NOTE — Assessment & Plan Note (Signed)
 Discussed approaches to manage. I recommend he try drinking 6 oz. of tonic water (with quinine) at bedtime.

## 2024-04-23 NOTE — Assessment & Plan Note (Signed)
 I will reassess the A1c and blood sugar today.

## 2024-04-23 NOTE — Assessment & Plan Note (Signed)
 Slowly progressive increase over the past year. I will repeat a PSA today.

## 2024-04-23 NOTE — Assessment & Plan Note (Signed)
 Stable and mild. We will monitor.

## 2024-04-23 NOTE — Progress Notes (Signed)
 [facility] PRIMARY CARE [facility] PRIMARY [facility] [address] [place] [address] Dept: [phone] Dept Fax: [phone]  Chronic Care Office Visit  Subjective:    Patient ID: Ronald Davenport, male    DOB: September 17, 1951, 73 y.o..   MRN: [id number]  Chief Complaint  Patient presents with   Hypertension    3 month f/u HTN.     History of Present Illness:  Patient is in today for reassessment of chronic medical issues.  Ronald Davenport has a history of essential hypertension. He is managed on olmesartan  20 mg daily. He ahd previously been on olmesartan -HCYZ, but developed weakness and dehydration. He notes that he ahs not taken his blood pressure medicine the last 2 days.  Ronald Davenport has a history of prediabetes. Ronald Davenport did some testing and started him on metformin  as well. However, he did not meet criteria for Type 2 diabetes as established by the ADA. He notes he has stopped taking the metformin .  Ronald Davenport has a history of a mildly elevated PSA test. It had increased from 4.05 (10/23) to 4.61 (10/24).   Ronald Davenport has a history of swelling of his feet. He notes there is some currently present.  Ronald Davenport notes some cramping of his calf, esp. at night/early mornign hours while in bed.  Past Medical History: Patient Active Problem List   Diagnosis Date Noted   Nocturnal leg cramps 04/23/2024   Syncope 01/19/2024   Trigger finger, left middle finger 09/28/2023   Bilateral lower extremity edema 09/22/2023   Primary hypertension 08/29/2023   Prediabetes 09/06/2022   Bilateral knee pain 09/05/2022   Hemorrhoids 09/05/2022   History of colon polyps 09/05/2022   Chronic left-sided low back pain with left-sided sciatica 01/05/2017   Adrenal adenoma 11/16/2015   GERD (gastroesophageal reflux disease) 11/28/2014   Chronic back pain 11/27/2014   BPH (benign prostatic hyperplasia) 09/07/2014   Elevated PSA 02/12/2013   Past Surgical History:  Procedure Laterality Date    CATARACT EXTRACTION Left 11/27/2004   PROSTATE BIOPSY     negative in 2011   ROTATOR CUFF REPAIR Right 11/27/2004   Family History  Problem Relation Age of Onset   Stroke Mother    Diabetes Sister    Colon polyps Brother    Colon cancer Brother    Heart disease Brother    Esophageal cancer Neg Hx    Rectal cancer Neg Hx    Stomach cancer Neg Hx    Outpatient Medications Prior to Visit  Medication Sig Dispense Refill   finasteride  (PROSCAR ) 5 MG tablet Take 5 mg by mouth daily. Reported on 11/16/2015     ibuprofen  (ADVIL ,MOTRIN ) 800 MG tablet Take 1 tablet (800 mg total) by mouth every 8 (eight) hours as needed. 30 tablet 0   olmesartan  (BENICAR ) 20 MG tablet Take 1 tablet (20 mg total) by mouth daily. 90 tablet 3   metFORMIN  (GLUCOPHAGE ) 500 MG tablet Take 0.5 tablets (250 mg total) by mouth 2 (two) times daily with a meal. (Patient not taking: Reported on 04/23/2024) 30 tablet 5   HYDROcodone  bit-homatropine (HYCODAN) 5-1.5 MG/5ML syrup Take 5 mLs by mouth every 8 (eight) hours as needed for cough. 120 mL 0   Menthol -Methyl Salicylate  (MUSCLE RUB) 10-15 % CREA Apply 1 Application topically as needed for muscle pain. (Patient not taking: Reported on 04/23/2024) 15 g 0   No facility-administered medications prior to visit.   Allergies  Allergen Reactions   Amlodipine  Other (See Comments)  Edema   Objective:   Today's Vitals   04/23/24 1342 04/23/24 1407  BP: (!) 140/78 (!) 142/80  Pulse: (!) 59   Temp: (!) 97.4 F (36.3 C)   TempSrc: Temporal   SpO2: 100%   Weight: 138 lb (62.6 kg)   Height: 5\' 8"  (1.727 m)    Body mass index is 20.98 kg/m.   General: Well developed, well nourished. No acute distress. Extremities: Full ROM. No joint swelling or tenderness. Mild edema of the dorsum of the feet. Psych: Alert and oriented. Normal mood and affect.  Health Maintenance Due  Topic Date Due   DTaP/Tdap/Td (1 - Tdap) Never done   Zoster Vaccines- Shingrix (1 of 2) Never  done   Medicare Annual Wellness (AWV)  11/01/2023     Assessment & Plan:   Problem List Items Addressed This Visit       Cardiovascular and Mediastinum   Primary hypertension - Primary   Blood pressure is elevated today, though Ronald Davenport has been off of his medication. I recommend he resume olmesartan  20 mg daily.      Relevant Orders   Basic metabolic panel with GFR (Completed)     Nervous and Auditory   Chronic left-sided low back pain with left-sided sciatica   I will renew his back rub.      Relevant Medications   Menthol -Methyl Salicylate  (MUSCLE RUB) 10-15 % CREA     Other   Bilateral lower extremity edema   Stable and mild. We will monitor.      Elevated PSA   Slowly progressive increase over the past year. I will repeat a PSA today.      Relevant Orders   PSA (Completed)   Nocturnal leg cramps   Discussed approaches to manage. I recommend he try drinking 6 oz. of tonic water (with quinine) at bedtime.      Relevant Orders   Basic metabolic panel with GFR (Completed)   Magnesium  (Completed)   Prediabetes   I will reassess the A1c and blood sugar today.      Relevant Orders   Hemoglobin A1c (Completed)   Basic metabolic panel with GFR (Completed)    Return in about 4 months (around 08/24/2024) for Reassessment.   Graig Lawyer, MD

## 2024-05-02 DIAGNOSIS — H25011 Cortical age-related cataract, right eye: Secondary | ICD-10-CM | POA: Diagnosis not present

## 2024-05-02 DIAGNOSIS — H2589 Other age-related cataract: Secondary | ICD-10-CM | POA: Diagnosis not present

## 2024-05-05 ENCOUNTER — Telehealth: Payer: Self-pay | Admitting: Family Medicine

## 2024-05-05 NOTE — Telephone Encounter (Signed)
 Missed Visits: 04/09/2023, 11/30/2023, and 12/18/2023   Dismissal was reversed 01/09/2024  04/22/2024 no show - pt was seen 04/23/2024  Do you want to dismiss?

## 2024-05-06 NOTE — Telephone Encounter (Signed)
 Noted

## 2024-05-16 ENCOUNTER — Ambulatory Visit: Payer: Self-pay

## 2024-05-16 NOTE — Telephone Encounter (Signed)
 FYI Only or Action Required?: FYI only for provider.  Patient was last seen in primary care on 04/23/2024 by Graig Lawyer, MD. Called Nurse Triage reporting Foot Swelling. Symptoms began a week ago. Interventions attempted: Nothing. Symptoms are: unchanged.  Triage Disposition: See PCP When Office is Open (Within 3 Days)  Patient/caregiver understands and will follow disposition?: Yes  ** Pt. Scheduled with Dr. Hildy Lowers for 6/24**                Copied from CRM #[id number]. Topic: Clinical - Red Word Triage >> May 16, 2024  2:15 PM Kita Perish H wrote: [facility] that prompted transfer to Nurse Triage: Feet are swollen Reason for Disposition  [1] MILD swelling of both ankles (i.e., pedal edema) AND [2] new-onset or worsening  Answer Assessment - Initial Assessment Questions 1. ONSET: When did the swelling start? (e.g., minutes, hours, days)      X 1 week  2. LOCATION: What part of the leg is swollen?  Are both legs swollen or just one leg?     BIL foot swelling    3. SEVERITY: How bad is the swelling? (e.g., localized; mild, moderate, severe)   - Localized: Small area of swelling localized to one leg.   - MILD pedal edema: Swelling limited to foot and ankle, pitting edema < 1/4 inch (6 mm) deep, rest and elevation eliminate most or all swelling.   - MODERATE edema: Swelling of lower leg to knee, pitting edema > 1/4 inch (6 mm) deep, rest and elevation only partially reduce swelling.   - SEVERE edema: Swelling extends above knee, facial or hand swelling present.       Mild/moderate  swelling    4. REDNESS: Does the swelling look red or infected?     No  5. PAIN: Is the swelling painful to touch? If Yes, ask: How painful is it?   (Scale 1-10; mild, moderate or severe)     Right foot has mild pain, no pain in the left  6. FEVER: Do you have a fever? If Yes, ask: What is it, how was it measured, and when did it start?      No  7. CAUSE: What do you  think is causing the leg swelling?     Unknown   8. MEDICAL HISTORY: Do you have a history of blood clots (e.g., DVT), cancer, heart failure, kidney disease, or liver failure?     No  9. RECURRENT SYMPTOM: Have you had leg swelling before? If Yes, ask: When was the last time? What happened that time?     Last October   10. OTHER SYMPTOMS: Do you have any other symptoms? (e.g., chest pain, difficulty breathing)   No  Protocols used: Leg Swelling and Edema-A-AH

## 2024-05-20 ENCOUNTER — Ambulatory Visit (INDEPENDENT_AMBULATORY_CARE_PROVIDER_SITE_OTHER): Admitting: Family Medicine

## 2024-05-20 VITALS — BP 120/70 | HR 58 | Temp 97.7°F | Ht 68.0 in | Wt 139.8 lb

## 2024-05-20 DIAGNOSIS — I872 Venous insufficiency (chronic) (peripheral): Secondary | ICD-10-CM | POA: Diagnosis not present

## 2024-05-20 DIAGNOSIS — H938X2 Other specified disorders of left ear: Secondary | ICD-10-CM

## 2024-05-20 DIAGNOSIS — Z3183 Encounter for assisted reproductive fertility procedure cycle: Secondary | ICD-10-CM

## 2024-05-20 MED ORDER — FUROSEMIDE 20 MG PO TABS: 20.0000 mg | ORAL_TABLET | Freq: Every day | ORAL | 0 refills | Status: DC | PRN

## 2024-05-20 NOTE — Patient Instructions (Signed)
  VISIT SUMMARY: During today's visit, we discussed your recent bilateral leg swelling and your interest in a fertility assessment. We reviewed your symptoms, medical history, and current medications. We also discussed potential causes and treatment options for your leg swelling and planned the next steps for your fertility evaluation.  YOUR PLAN: -BILATERAL LOWER EXTREMITY EDEMA: Bilateral lower extremity edema means swelling in both legs, often due to fluid buildup. This can be caused by prolonged standing, certain medications, or venous insufficiency. We will manage this with furosemide 20 mg as needed, compression socks, and leg elevation. You will also be referred to vascular surgery for further evaluation and treatment options.  -FERTILITY ASSESSMENT: A fertility assessment will help determine your fertility potential at your current age. We will refer you to a reproductive endocrinologist for a comprehensive evaluation.  -EAR FULLNESS: We are referring you to an Ear Nose and Throat doctor. You may also follow up as needed with our office.   INSTRUCTIONS: 1. Take furosemide 20 mg as needed for swelling. 2. Use compression socks to help reduce leg swelling. 3. Elevate your legs whenever possible to decrease swelling. 4. Follow up with vascular surgery for further evaluation of your venous insufficiency. 5. Schedule an appointment with a reproductive endocrinologist for a fertility assessment. 6. Follow up with ENT for ear symptoms in left ear.

## 2024-05-20 NOTE — Progress Notes (Signed)
 Assessment & Plan   Assessment/Plan:    Assessment & Plan Bilateral lower extremity edema Chronic bilateral lower extremity edema, worsened over the past two weeks with pitting edema up to the ankles. Previous ultrasound ruled out deep vein thrombosis, indicating venous stasis disease. Contributing factors may include prolonged standing and medication history. No significant pain, but discomfort in the right foot. Blood pressure is well-managed, and discontinuation of amlodipine  provided some improvement. Discussed the benign nature of venous insufficiency and potential benefits of conservative management with furosemide and compression therapy. Agreed on referral to vascular surgery for further evaluation and additional treatment options. - Prescribe furosemide 20 mg as needed for swelling. - Refer to vascular surgery for further evaluation of venous insufficiency. - Recommend use of compression socks. - Advise elevating legs to reduce swelling.  Fertility assessment Desires fertility assessment following the passing of his wife. Interested in understanding fertility potential at his current age before making future decisions. Referral to reproductive endocrinology is the appropriate next step to evaluate fertility status. - Refer to reproductive endocrinology for fertility assessment.  Ear Fullness in Left Ear Due to time limitations, this was recommended to be discussed at a future visit. Patient asked instead for a referral to ENT - Refer to ENT for left ear symptoms      There are no discontinued medications.  Return if symptoms worsen or fail to improve.        Subjective:   Encounter date: 05/20/2024  Ronald Davenport is a 73 y.o. male who has Elevated PSA; BPH (benign prostatic hyperplasia); GERD (gastroesophageal reflux disease); Adrenal adenoma; Chronic left-sided low back pain with left-sided sciatica; Bilateral knee pain; Hemorrhoids; History of colon polyps;  Prediabetes; Primary hypertension; Bilateral lower extremity edema; Trigger finger, left middle finger; Syncope; and Nocturnal leg cramps on their problem list..   He  has a past medical history of Adrenal adenoma, Arthritis, BPH (benign prostatic hyperplasia), BPH with elevated PSA, Cataract, Headache, Hematospermia, and Hematuria.SABRA   He presents with chief complaint of Foot Swelling, Ear Fullness (started year ago), and Consult (Wants to know if he can have sperm count checked ) .   Discussed the use of AI scribe software for clinical note transcription with the patient, who gave verbal consent to proceed.  History of Present Illness Matthieu Loftus is a 73 year old with hypertension who presents with bilateral leg swelling.  He has been experiencing bilateral leg swelling for the past two to two and a half weeks, primarily affecting the ankles and lower legs, with both legs equally involved. He experiences mild discomfort in the right foot due to the swelling but denies significant pain. The swelling was less pronounced during his last visit on Apr 23, 2024.  He has a history of hypertension and was previously on amlodipine , which was discontinued due to swelling. He is uncertain if his current medication is contributing to the swelling. His occupation involves standing for several hours in the evening, which he suspects may exacerbate the condition.  He has undergone prior imaging, including an ultrasound of the legs last year, and is concerned about the swelling, seeking further evaluation.       Past Surgical History:  Procedure Laterality Date   CATARACT EXTRACTION Left 11/27/2004   PROSTATE BIOPSY     negative in 2011   ROTATOR CUFF REPAIR Right 11/27/2004    Outpatient Medications Prior to Visit  Medication Sig Dispense Refill   finasteride  (PROSCAR ) 5 MG tablet Take 5 mg  by mouth daily. Reported on 11/16/2015     ibuprofen  (ADVIL ,MOTRIN ) 800 MG tablet Take 1 tablet (800 mg  total) by mouth every 8 (eight) hours as needed. 30 tablet 0   Menthol -Methyl Salicylate  (MUSCLE RUB) 10-15 % CREA Apply 1 Application topically as needed for muscle pain. 15 g 5   metFORMIN  (GLUCOPHAGE ) 500 MG tablet Take 0.5 tablets (250 mg total) by mouth 2 (two) times daily with a meal. (Patient not taking: Reported on 04/23/2024) 30 tablet 5   olmesartan  (BENICAR ) 20 MG tablet Take 1 tablet (20 mg total) by mouth daily. 90 tablet 3   No facility-administered medications prior to visit.    Family History  Problem Relation Age of Onset   Stroke Mother    Diabetes Sister    Colon polyps Brother    Colon cancer Brother    Heart disease Brother    Esophageal cancer Neg Hx    Rectal cancer Neg Hx    Stomach cancer Neg Hx     Social History   Socioeconomic History   Marital status: Widowed    Spouse name: Not on file   Number of children: 3   Years of education: College    Highest education level: Bachelor's degree (e.g., BA, AB, BS)  Occupational History   Occupation: disability    Comment: DDD lumbar/chronic back pain  Tobacco Use   Smoking status: Never   Smokeless tobacco: Never  Vaping Use   Vaping status: Never Used  Substance and Sexual Activity   Alcohol  use: Not Currently    Comment: occasionally - maybe 1-2 times per year.   Drug use: No   Sexual activity: Yes  Other Topics Concern   Not on file  Social History Narrative   From [place]; [place].   Social Drivers of Corporate investment banker Strain: Low Risk  (10/31/2022)   Overall Financial Resource Strain (CARDIA)    Difficulty of Paying Living Expenses: Not hard at all  Food Insecurity: No Food Insecurity (01/20/2024)   Hunger Vital Sign    Worried About Running Out of Food in the Last Year: Never true    Ran Out of Food in the Last Year: Never true  Transportation Needs: No Transportation Needs (01/20/2024)   PRAPARE - Administrator, Civil Service (Medical): No    Lack of  Transportation (Non-Medical): No  Physical Activity: Inactive (10/31/2022)   Exercise Vital Sign    Days of Exercise per Week: 0 days    Minutes of Exercise per Session: 0 min  Stress: No Stress Concern Present (10/31/2022)   [employer] of Occupational Health - Occupational Stress Questionnaire    Feeling of Stress : Not at all  Social Connections: Not on file  Intimate Partner Violence: Not At Risk (01/20/2024)   Humiliation, Afraid, Rape, and Kick questionnaire    Fear of Current or Ex-Partner: No    Emotionally Abused: No    Physically Abused: No    Sexually Abused: No  Objective:  Physical Exam: BP 120/70   Pulse (!) 58   Temp 97.7 F (36.5 C) (Temporal)   Ht 5' 8 (1.727 m)   Wt 139 lb 12.8 oz (63.4 kg)   SpO2 99%   BMI 21.26 kg/m    Physical Exam GENERAL: Alert, cooperative, well developed, no acute distress. HEENT: Normocephalic, normal oropharynx, moist mucous membranes. CHEST: Clear to auscultation bilaterally, no wheezes, rhonchi, or crackles. CARDIOVASCULAR: Normal heart rate and rhythm, S1 and S2 normal without murmurs. ABDOMEN: Soft, non-tender, non-distended, without organomegaly, normal bowel sounds. EXTREMITIES: Trace pitting edema present in lower extremities bilaterally to ankles, no cyanosis. NEUROLOGICAL: Cranial nerves grossly intact, moves all extremities without gross motor or sensory deficit.   Physical Exam  No results found.  Recent Results (from the past 2160 hours)  PSA     Status: Abnormal   Collection Time: 04/23/24  2:22 PM  Result Value Ref Range   PSA 4.56 (H) 0.10 - 4.00 ng/mL    Comment: Test performed using Access Hybritech PSA Assay, a parmagnetic partical, chemiluminecent immunoassay.  Hemoglobin A1c     Status: None   Collection Time: 04/23/24  2:22 PM  Result Value Ref Range   Hgb A1c MFr Bld 5.9 4.6 - 6.5 %    Comment:  Glycemic Control Guidelines for People with Diabetes:Non Diabetic:  <6%Goal of Therapy: <7%Additional Action Suggested:  >8%   Basic metabolic panel with GFR     Status: Abnormal   Collection Time: 04/23/24  2:22 PM  Result Value Ref Range   Sodium 139 135 - 145 mEq/L   Potassium 4.4 3.5 - 5.1 mEq/L   Chloride 102 96 - 112 mEq/L   CO2 31 19 - 32 mEq/L   Glucose, Bld 93 70 - 99 mg/dL   BUN 19 6 - 23 mg/dL   Creatinine, Ser 8.77 0.40 - 1.50 mg/dL   GFR 40.51 (L) >39.99 mL/min    Comment: Calculated using the CKD-EPI Creatinine Equation (2021)   Calcium 9.4 8.4 - 10.5 mg/dL  Magnesium      Status: None   Collection Time: 04/23/24  2:22 PM  Result Value Ref Range   Magnesium  2.0 1.5 - 2.5 mg/dL        Beverley Adine Hummer, MD, MS

## 2024-06-19 DIAGNOSIS — H2589 Other age-related cataract: Secondary | ICD-10-CM | POA: Diagnosis not present

## 2024-06-19 DIAGNOSIS — H2511 Age-related nuclear cataract, right eye: Secondary | ICD-10-CM | POA: Diagnosis not present

## 2024-06-24 ENCOUNTER — Telehealth: Payer: Self-pay | Admitting: Family Medicine

## 2024-06-24 NOTE — Telephone Encounter (Signed)
Left VM to rtn call. Dm/cma       

## 2024-06-24 NOTE — Telephone Encounter (Signed)
 Copied from CRM [id number]. Topic: Referral - Status >> Jun 23, 2024  5:00 PM Shereese L wrote: Reason for CRM: patient called in to have the referral for [facility] to be sent to them again

## 2024-06-24 NOTE — Telephone Encounter (Signed)
 Thanks. Dm/cma

## 2024-06-26 ENCOUNTER — Telehealth: Payer: Self-pay

## 2024-06-26 NOTE — Telephone Encounter (Signed)
 Received a call from patient that he needs something sent over from us  to the Eye surgeon so that they don't cancel his appointment.  Advised him that I haven't received anything and he will call them to have them resend it to us .  Dm/cma

## 2024-06-27 DIAGNOSIS — M25562 Pain in left knee: Secondary | ICD-10-CM | POA: Diagnosis not present

## 2024-06-27 DIAGNOSIS — M545 Low back pain, unspecified: Secondary | ICD-10-CM | POA: Diagnosis not present

## 2024-06-27 DIAGNOSIS — M5137 Other intervertebral disc degeneration, lumbosacral region with discogenic back pain only: Secondary | ICD-10-CM | POA: Diagnosis not present

## 2024-06-27 NOTE — Telephone Encounter (Signed)
 Received form from Dr Alvan, MD for cataract surgery wanting las OV notes.  Faxed LOV notes from 04/23/24 to fax number [phone]. Dm/cma

## 2024-07-01 DIAGNOSIS — H25811 Combined forms of age-related cataract, right eye: Secondary | ICD-10-CM | POA: Insufficient documentation

## 2024-07-03 ENCOUNTER — Ambulatory Visit: Admitting: Family Medicine

## 2024-07-03 DIAGNOSIS — I10 Essential (primary) hypertension: Secondary | ICD-10-CM | POA: Diagnosis not present

## 2024-07-03 DIAGNOSIS — H269 Unspecified cataract: Secondary | ICD-10-CM | POA: Diagnosis not present

## 2024-07-03 DIAGNOSIS — H2589 Other age-related cataract: Secondary | ICD-10-CM | POA: Diagnosis not present

## 2024-07-03 DIAGNOSIS — H2511 Age-related nuclear cataract, right eye: Secondary | ICD-10-CM | POA: Diagnosis not present

## 2024-07-04 NOTE — Telephone Encounter (Signed)
 Pt is wanting this referral resent to [facility] placed by Dr Thedora on 05/20/24.

## 2024-07-07 DIAGNOSIS — R3121 Asymptomatic microscopic hematuria: Secondary | ICD-10-CM | POA: Diagnosis not present

## 2024-07-07 NOTE — Telephone Encounter (Signed)
 I called and confirmed fax #.  I resent fax to correcte fax # [phone]

## 2024-07-24 DIAGNOSIS — M19011 Primary osteoarthritis, right shoulder: Secondary | ICD-10-CM | POA: Diagnosis not present

## 2024-07-30 DIAGNOSIS — H25813 Combined forms of age-related cataract, bilateral: Secondary | ICD-10-CM | POA: Diagnosis not present

## 2024-07-30 DIAGNOSIS — Z961 Presence of intraocular lens: Secondary | ICD-10-CM | POA: Diagnosis not present

## 2024-07-30 DIAGNOSIS — H43813 Vitreous degeneration, bilateral: Secondary | ICD-10-CM | POA: Diagnosis not present

## 2024-08-04 ENCOUNTER — Institutional Professional Consult (permissible substitution) (INDEPENDENT_AMBULATORY_CARE_PROVIDER_SITE_OTHER): Admitting: Otolaryngology

## 2024-08-04 ENCOUNTER — Ambulatory Visit (INDEPENDENT_AMBULATORY_CARE_PROVIDER_SITE_OTHER): Admitting: Audiology

## 2024-08-13 DIAGNOSIS — H25811 Combined forms of age-related cataract, right eye: Secondary | ICD-10-CM | POA: Diagnosis not present

## 2024-08-21 DIAGNOSIS — M4726 Other spondylosis with radiculopathy, lumbar region: Secondary | ICD-10-CM | POA: Diagnosis not present

## 2024-09-08 NOTE — Progress Notes (Signed)
 Ronald Davenport                                          MRN: [id number]   09/08/2024   The VBCI Quality Team Specialist reviewed this patient medical record for the purposes of chart review for care gap closure. The following were reviewed: chart review for care gap closure-kidney health evaluation for diabetes:eGFR  and uACR.    VBCI Quality Team

## 2024-09-08 NOTE — Progress Notes (Signed)
 Jamesen Stahnke                                          MRN: [id number]   09/08/2024   The VBCI Quality Team Specialist reviewed this patient medical record for the purposes of chart review for care gap closure. The following were reviewed: abstraction for care gap closure-glycemic status assessment.    VBCI Quality Team

## 2024-09-11 ENCOUNTER — Telehealth: Payer: Self-pay

## 2024-09-11 NOTE — Telephone Encounter (Signed)
 Called and spoke to patient, advised that provider hasn't look at the results yet.  Will call him back after he reviews them. Dm/cma

## 2024-09-11 NOTE — Telephone Encounter (Signed)
 Copied from CRM [id number]. Topic: Clinical - Lab/Test Results >> Sep 10, 2024  4:47 PM Alfonso ORN wrote: Reason for CRM: Ruthellen Mercury and is requesting nurse call him back for results of test . Please advise [phone] (M)

## 2024-09-12 NOTE — Telephone Encounter (Signed)
 Unable to leave message.  Will call later. Dm/cma

## 2024-09-12 NOTE — Telephone Encounter (Signed)
 Left VM to rtn call to  advise to call the Fertility clinic to discuss the results.    [employer]  [phone]

## 2024-09-12 NOTE — Telephone Encounter (Unsigned)
 Copied from CRM #[id number]. Topic: General - Other >> Sep 12, 2024  4:25 PM Nessti S wrote: Reason for CRM: patient is calling because he will need another referral to [facility] and needs to do his labs over. Call back number [phone]

## 2024-09-12 NOTE — Telephone Encounter (Signed)
Left VM to rtn call. Dm/cma       

## 2024-09-15 NOTE — Telephone Encounter (Signed)
Left VM to rtn call. Dm/cma       

## 2024-09-16 NOTE — Telephone Encounter (Signed)
Left VM to rtn call. Dm/cma       

## 2024-09-19 ENCOUNTER — Other Ambulatory Visit: Payer: Self-pay | Admitting: Vascular Surgery

## 2024-09-19 DIAGNOSIS — M7989 Other specified soft tissue disorders: Secondary | ICD-10-CM

## 2024-09-22 NOTE — Telephone Encounter (Signed)
 Please see other messages.  Have been unable to reach him.  Dm/cma

## 2024-09-22 NOTE — Telephone Encounter (Unsigned)
 Copied from CRM #[id number]. Topic: General - Other >> Sep 12, 2024  4:25 PM Nessti S wrote: Reason for CRM: patient is calling because he will need another referral to [facility] and needs to do his labs over. Call back number [phone] >> Sep 19, 2024  4:50 PM Lonell PEDLAR wrote: Patient called again regarding referral, provided alternate number, please call [phone]

## 2024-09-29 NOTE — Telephone Encounter (Unsigned)
 Copied from CRM [id number]. Topic: Referral - Status >> Sep 26, 2024  4:52 PM Alfonso ORN wrote: Reason for CRM: pt calling to f/u on referral to fertility facility. Relayed info and advised 7 bsd for referral requests

## 2024-09-29 NOTE — Telephone Encounter (Signed)
Left VM to rtn call. Dm/cma       

## 2024-10-03 NOTE — Telephone Encounter (Signed)
 Called and patient notified that it was probably just a reminder call.  Then I called [facility] and was advised that they would definitely need another referral due to it was just for one visit.  Will discuss with provider on his upcoming appointment on 10/07/24.  Dm/cma

## 2024-10-07 ENCOUNTER — Encounter: Payer: Self-pay | Admitting: Family Medicine

## 2024-10-07 ENCOUNTER — Ambulatory Visit: Admitting: Family Medicine

## 2024-10-07 VITALS — BP 124/64 | HR 64 | Temp 98.2°F | Ht 68.0 in | Wt 139.3 lb

## 2024-10-07 DIAGNOSIS — Z9189 Other specified personal risk factors, not elsewhere classified: Secondary | ICD-10-CM

## 2024-10-07 DIAGNOSIS — G8929 Other chronic pain: Secondary | ICD-10-CM

## 2024-10-07 DIAGNOSIS — R600 Localized edema: Secondary | ICD-10-CM

## 2024-10-07 DIAGNOSIS — R7303 Prediabetes: Secondary | ICD-10-CM

## 2024-10-07 DIAGNOSIS — M5442 Lumbago with sciatica, left side: Secondary | ICD-10-CM

## 2024-10-07 DIAGNOSIS — I10 Essential (primary) hypertension: Secondary | ICD-10-CM

## 2024-10-07 DIAGNOSIS — R972 Elevated prostate specific antigen [PSA]: Secondary | ICD-10-CM | POA: Diagnosis not present

## 2024-10-07 MED ORDER — MUSCLE RUB 10-15 % EX CREA: 1.0000 | TOPICAL_CREAM | CUTANEOUS | 5 refills | Status: AC | PRN

## 2024-10-07 NOTE — Assessment & Plan Note (Addendum)
Blood pressure is in good control. Continue olmesartan 20 mg daily.

## 2024-10-07 NOTE — Progress Notes (Signed)
 [facility] [address] [place] [address] Dept: [phone] Dept Fax: [phone]  Chronic Care Office Visit  Subjective:    Patient ID: Ronald Davenport, male    DOB: 1951/06/26, 73 y.o..   MRN: [id number]  Chief Complaint  Patient presents with   Hypertension    F/u HTN  and needs a new referral Fertility Clinic.    History of Present Illness:  Patient is in today for reassessment of chronic medical conditions.   Mr. Farrington has a history of essential hypertension. He is managed on olmesartan  20 mg daily.  Mr. Mirabal has a history of prediabetes. Ms. Katheen did some testing and started him on metformin  as well. However, he did not meet criteria for Type 2 diabetes as established by the ADA. He had been prescribed metformin , but quit takign this.   Mr. Yanke has a history of a mildly elevated PSA test. He is followed by urology. It was recommended he have a repeat in Nov. 2025.   Mr. Viscomi has a history of swelling of his feet. He was treated with furosemide , which has improved this. He has a pending appointment with urology.  Mr. Clingan is planning to remarry. He has concerns about his fertility. He was sent by urology to [facility] for a semen analysis. He notes that he was told his semen sample was inadequate. He is requesting a referral back to them to repeat his semen analysis.   Past Medical History: Patient Active Problem List   Diagnosis Date Noted   Nocturnal leg cramps 04/23/2024   Syncope 01/19/2024   Trigger finger, left middle finger 09/28/2023   Bilateral lower extremity edema 09/22/2023   Primary hypertension 08/29/2023   Prediabetes 09/06/2022   Bilateral knee pain 09/05/2022   Hemorrhoids 09/05/2022   History of colon polyps 09/05/2022   Chronic left-sided low back pain with left-sided sciatica 01/05/2017   Adrenal adenoma 11/16/2015   GERD (gastroesophageal reflux disease) 11/28/2014    BPH (benign prostatic hyperplasia) 09/07/2014   Elevated PSA 02/12/2013   Past Surgical History:  Procedure Laterality Date   CATARACT EXTRACTION Left 11/27/2004   PROSTATE BIOPSY     negative in 2011   ROTATOR CUFF REPAIR Right 11/27/2004   Family History  Problem Relation Age of Onset   Stroke Mother    Diabetes Sister    Colon polyps Brother    Colon cancer Brother    Heart disease Brother    Esophageal cancer Neg Hx    Rectal cancer Neg Hx    Stomach cancer Neg Hx    Outpatient Medications Prior to Visit  Medication Sig Dispense Refill   finasteride  (PROSCAR ) 5 MG tablet Take 5 mg by mouth daily. Reported on 11/16/2015     furosemide  (LASIX ) 20 MG tablet Take 1 tablet (20 mg total) by mouth daily as needed for edema or fluid. 30 tablet 0   ibuprofen  (ADVIL ,MOTRIN ) 800 MG tablet Take 1 tablet (800 mg total) by mouth every 8 (eight) hours as needed. 30 tablet 0   Menthol -Methyl Salicylate  (MUSCLE RUB) 10-15 % CREA Apply 1 Application topically as needed for muscle pain. 15 g 5   metFORMIN  (GLUCOPHAGE ) 500 MG tablet Take 0.5 tablets (250 mg total) by mouth 2 (two) times daily with a meal. (Patient not taking: Reported on 04/23/2024) 30 tablet 5   olmesartan  (BENICAR ) 20 MG tablet Take 1 tablet (20 mg total) by mouth daily. 90 tablet 3  No facility-administered medications prior to visit.   Allergies  Allergen Reactions   Amlodipine  Other (See Comments)    Edema     Objective:   Today's Vitals   10/07/24 1522  BP: 124/64  Pulse: 64  Temp: 98.2 F (36.8 C)  TempSrc: Temporal  SpO2: 98%  Weight: 139 lb 4.8 oz (63.2 kg)  Height: 5' 8 (1.727 m)   Body mass index is 21.18 kg/m.   General: Well developed, well nourished. No acute distress. Extremities: No edema noted. Psych: Alert and oriented. Normal mood and affect.  Health Maintenance Due  Topic Date Due   DTaP/Tdap/Td (1 - Tdap) Never done   Zoster Vaccines- Shingrix (1 of 2) Never done   Medicare Annual  Wellness (AWV)  11/01/2023   Influenza Vaccine  06/27/2024   COVID-19 Vaccine (4 - 2025-26 season) 07/28/2024   Lab Results: Lab Results  Component Value Date   PSA 4.56 (H) 04/23/2024   PSA 4.61 (H) 08/29/2023   PSA 4.55 (H) 10/02/2022     Assessment & Plan:   Problem List Items Addressed This Visit       Cardiovascular and Mediastinum   Essential hypertension - Primary   Blood pressure is in good control. Continue olmesartan  20 mg daily.        Nervous and Auditory   Chronic left-sided low back pain with left-sided sciatica   I will renew his back rub.      Relevant Medications   Menthol -Methyl Salicylate  (MUSCLE RUB) 10-15 % CREA     Other   Bilateral lower extremity edema   Stable and mild. Continue furosemide  as needed. Vascular assessment pending.      Elevated PSA   Mildly elevated. Likely due to BPH. I will repeat a PSA today.      Relevant Orders   PSA, Medicare   Prediabetes   I will reassess the A1c and blood sugar today.      Relevant Orders   Basic metabolic panel with GFR   Hemoglobin A1c   Other Visit Diagnoses       At risk for fertility problems       I will place a new referral for him to have a semen analysis.   Relevant Orders   Ambulatory referral to Endocrinology       Return in about 3 months (around 01/07/2025) for Reassessment.   Garnette CHRISTELLA Simpler, MD  I,Emily Lagle,acting as a scribe for Garnette CHRISTELLA Simpler, MD.,have documented all relevant documentation on the behalf of Garnette CHRISTELLA Simpler, MD.  I, Garnette CHRISTELLA Simpler, MD, have reviewed all documentation for this visit. The documentation on 10/07/2024 for the exam, diagnosis, procedures, and orders are all accurate and complete.

## 2024-10-07 NOTE — Assessment & Plan Note (Signed)
 I will renew his back rub.

## 2024-10-07 NOTE — Assessment & Plan Note (Addendum)
 Stable and mild. Continue furosemide  as needed. Vascular assessment pending.

## 2024-10-07 NOTE — Assessment & Plan Note (Addendum)
 Mildly elevated. Likely due to BPH. I will repeat a PSA today.

## 2024-10-07 NOTE — Assessment & Plan Note (Signed)
 I will reassess the A1c and blood sugar today.

## 2024-10-08 ENCOUNTER — Ambulatory Visit: Payer: Self-pay | Admitting: Family Medicine

## 2024-10-08 LAB — BASIC METABOLIC PANEL WITH GFR
BUN: 18 mg/dL (ref 6–23)
CO2: 28 meq/L (ref 19–32)
Calcium: 8.9 mg/dL (ref 8.4–10.5)
Chloride: 104 meq/L (ref 96–112)
Creatinine, Ser: 1.09 mg/dL (ref 0.40–1.50)
GFR: 67.87 mL/min (ref >60.00)
Glucose, Bld: 80 mg/dL (ref 70–99)
Potassium: 4.1 meq/L (ref 3.5–5.1)
Sodium: 141 meq/L (ref 135–145)

## 2024-10-08 LAB — PSA, MEDICARE: PSA: 4.41 ng/mL — ABNORMAL HIGH (ref 0.10–4.00)

## 2024-10-10 ENCOUNTER — Telehealth: Payer: Self-pay

## 2024-10-10 NOTE — Telephone Encounter (Signed)
 Returning call to [facility] for lab results

## 2024-10-10 NOTE — Telephone Encounter (Signed)
 Already spoke to patient. Dm/cma

## 2024-10-13 NOTE — Progress Notes (Signed)
 Ronald Davenport                                          MRN: [id number]   10/13/2024   The VBCI Quality Team Specialist reviewed this patient medical record for the purposes of chart review for care gap closure. The following were reviewed: chart review for care gap closure-kidney health evaluation for diabetes:eGFR  and uACR.    VBCI Quality Team

## 2024-10-14 ENCOUNTER — Encounter: Payer: Self-pay | Admitting: Family Medicine

## 2024-10-14 NOTE — Progress Notes (Signed)
 Pharmacy Quality Measure Review  This patient is appearing on a report for being at risk of failing the adherence measure for hypertension (ACEi/ARB) medications this calendar year.   Medication: Olmesartan  20mg  Last fill date: 06/27 for 90 day supply  Spoke with patient, states he is taking daily but could not remember the last time he refilled it. He will call his [facility] pharmacy to refill the medication after our call. Had a questions about the fertility referral requested at his last appointment. Will reach out to Dr. Thedora to follow-up.  Angela Baalmann, PharmD [facility] Pharmacist

## 2024-10-15 NOTE — Telephone Encounter (Signed)
 Left detailed VM that will need another lab appointment to have the A1C re-drawn due to the tube clotted.  Dm/cma

## 2024-10-16 NOTE — Progress Notes (Signed)
 Patient ID: Ronald Davenport, male   DOB: [birth year], 73 y.o.   MRN: [id number]  Reason for Consult: No chief complaint on file.   Referred by Sebastian Beverley NOVAK, MD  Subjective:     HPI  Ronald Davenport is a 73 y.o. male who presents for evaluation of right lower extremity swelling Timeframe: A few months Symptoms: No pain, aching or heaviness Varicosities: No Previous wounds: No Previous DVT: No In compression: No  Past Medical History:  Diagnosis Date   Adrenal adenoma    left , serial CT scans by urology   Arthritis    BPH (benign prostatic hyperplasia)    with BOO, on finasteride     BPH with elevated PSA    PSA elevated in 2011, prostate bx in 2011 was negative    Cataract    Headache    Hematospermia    Hematuria    gross and microscopic, followed by [facility], neg cystoscopy   Family History  Problem Relation Age of Onset   Stroke Mother    Diabetes Sister    Colon polyps Brother    Colon cancer Brother    Heart disease Brother    Esophageal cancer Neg Hx    Rectal cancer Neg Hx    Stomach cancer Neg Hx    Past Surgical History:  Procedure Laterality Date   CATARACT EXTRACTION Left 11/27/2004   PROSTATE BIOPSY     negative in 2011   ROTATOR CUFF REPAIR Right 11/27/2004    Short Social History:  Social History   Tobacco Use   Smoking status: Never   Smokeless tobacco: Never  Substance Use Topics   Alcohol  use: Not Currently    Comment: occasionally - maybe 1-2 times per year.    Allergies  Allergen Reactions   Amlodipine  Other (See Comments)    Edema    Current Outpatient Medications  Medication Sig Dispense Refill   finasteride  (PROSCAR ) 5 MG tablet Take 5 mg by mouth daily. Reported on 11/16/2015     furosemide  (LASIX ) 20 MG tablet Take 1 tablet (20 mg total) by mouth daily as needed for edema or fluid. 30 tablet 0   Menthol -Methyl Salicylate  (MUSCLE RUB) 10-15 % CREA Apply 1 Application topically as needed for muscle pain. 15 g  5   olmesartan  (BENICAR ) 20 MG tablet Take 1 tablet (20 mg total) by mouth daily. 90 tablet 3   No current facility-administered medications for this visit.    REVIEW OF SYSTEMS All other systems were reviewed and are negative    Objective:  Objective   There were no vitals filed for this visit. There is no height or weight on file to calculate BMI.  Physical Exam General: no acute distress Cardiac: hemodynamically stable Extremities: Mild edema of right lower extremity from ankle to mid shin, minimal edema on the left Vascular:   Right: palpable DP, PT  Left: palpable DP, PT   Data: Reflux study +--------------+---------+------+-----------+------------+--------+  RIGHT        Reflux NoRefluxReflux TimeDiameter cmsComments                          Yes                                   +--------------+---------+------+-----------+------------+--------+  CFV          no                                              +--------------+---------+------+-----------+------------+--------+  FV mid        no                                              +--------------+---------+------+-----------+------------+--------+  Popliteal    no                                              +--------------+---------+------+-----------+------------+--------+  GSV at SFJ              yes    >500 ms      0.9               +--------------+---------+------+-----------+------------+--------+  GSV prox thigh          yes    >500 ms      0.45              +--------------+---------+------+-----------+------------+--------+  GSV mid thigh           yes    >500 ms      0.32              +--------------+---------+------+-----------+------------+--------+  GSV dist thigh          yes    >500 ms      0.5               +--------------+---------+------+-----------+------------+--------+  GSV at knee             yes    >500 ms      0.38               +--------------+---------+------+-----------+------------+--------+  GSV prox calf           yes    >500 ms      0.24              +--------------+---------+------+-----------+------------+--------+  GSV mid calf            yes    >500 ms      0.3               +--------------+---------+------+-----------+------------+--------+  SSV at [facility]    no                            0.37              +--------------+---------+------+-----------+------------+--------+  SSV prox calf no                            0.28              +--------------+---------+------+-----------+------------+--------+       Assessment/Plan:     Ronald Davenport is a 73 y.o. male with chronic venous insufficiency with C3 disease and reflux noted in right GSV.  Overall his symptoms are very mild.  He has some lower extremity swelling but does not have any pain, aching, heaviness or fatigue associated with his swelling. I explained the foundation of CVI treatment of compression and elevation  I recommended medical grade graduated compression stockings and intermittent leg elevation We also discussed that many patients find symptom improvement with exercise He was given our healthy veins made simple explanation sheet. Encouraged to sleep  in bed and not in chair nightly and start using compression stockings.  Follow-up as needed     Norman GORMAN Serve MD [facility]

## 2024-10-17 ENCOUNTER — Ambulatory Visit (HOSPITAL_COMMUNITY)
Admission: RE | Admit: 2024-10-17 | Discharge: 2024-10-17 | Disposition: A | Discharge status: Home / Self Care | Source: Ambulatory Visit | Attending: Vascular Surgery | Admitting: Vascular Surgery

## 2024-10-17 ENCOUNTER — Encounter: Payer: Self-pay | Admitting: Family Medicine

## 2024-10-17 ENCOUNTER — Ambulatory Visit: Admitting: Vascular Surgery

## 2024-10-17 ENCOUNTER — Encounter: Payer: Self-pay | Admitting: Vascular Surgery

## 2024-10-17 VITALS — BP 139/81 | HR 58 | Temp 98.2°F | Resp 18 | Ht 68.0 in | Wt 143.4 lb

## 2024-10-17 DIAGNOSIS — I872 Venous insufficiency (chronic) (peripheral): Secondary | ICD-10-CM

## 2024-10-17 DIAGNOSIS — M7989 Other specified soft tissue disorders: Secondary | ICD-10-CM | POA: Diagnosis present

## 2024-11-17 NOTE — Progress Notes (Signed)
 Ronald Davenport                                          MRN: [id number]   11/17/2024   The [facility] Quality Team Specialist reviewed this patient medical record for the purposes of chart review for care gap closure. The following were reviewed: chart review for care gap closure-kidney health evaluation for diabetes:eGFR  and uACR.    [facility] Quality Team

## 2024-12-19 ENCOUNTER — Other Ambulatory Visit: Payer: Self-pay | Admitting: Family Medicine

## 2024-12-19 ENCOUNTER — Ambulatory Visit: Payer: Self-pay

## 2024-12-19 DIAGNOSIS — I872 Venous insufficiency (chronic) (peripheral): Secondary | ICD-10-CM

## 2024-12-19 MED ORDER — FUROSEMIDE 20 MG PO TABS: 20.0000 mg | ORAL_TABLET | Freq: Every day | ORAL | 0 refills | Status: DC | PRN

## 2024-12-19 MED ORDER — FUROSEMIDE 20 MG PO TABS: 20.0000 mg | ORAL_TABLET | Freq: Every day | ORAL | 0 refills | Status: AC | PRN

## 2024-12-19 NOTE — Telephone Encounter (Signed)
 Evaluation in 2025 by vascular found patient to have chronic venous insufficiency. Despite his statement, he was prescribed furosemide  in June 2025 by Dr. Sebastian. I will renew this prescription. He should also follow the advice of the vascular surgeon regarding elevating his legs and wearing compression stockings.  Garnette EMERSON Simpler, MD

## 2024-12-19 NOTE — Progress Notes (Signed)
 Ronald Davenport                                          MRN: [id number]   12/19/2024   The [facility] Quality Team Specialist reviewed this patient medical record for the purposes of chart review for care gap closure. The following were reviewed: chart review for care gap closure-glycemic status assessment.    [facility] Quality Team

## 2024-12-19 NOTE — Telephone Encounter (Signed)
 FYI Only or Action Required?: Action required by provider: clinical question for provider.  Patient was last seen in primary care on 10/07/2024 by Thedora Garnette HERO, MD.  Called Nurse Triage reporting Foot Swelling.  Symptoms began about a month ago.  Interventions attempted: Nothing.  Symptoms are: gradually worsening.  Triage Disposition: See PCP When Office is Open (Within 3 Days)  Patient/caregiver understands and will follow disposition?: Yes  Reason for Disposition  [1] MILD swelling of both ankles (i.e., pedal edema) AND [2] new-onset or getting worse  Answer Assessment - Initial Assessment Questions Pt reports bilateral foot edema x 1 months. Did see vascular provider in November for same. Patient states he is not taking furosemide . States is only taking finasteride , denies taking or having furosemide . Patient denies obvious difference in skin color of feet. Denies SOB.    Also asking for the name of the ear referral.   Scheduled for soonest available acute. ED advised if any SOB occurs. Advised will send PCP a message regarding lasix  and confirmed best call back #[phone]    1. ONSET: When did the swelling start? (e.g., minutes, hours, days)     1 month 2. LOCATION: What part of the leg is swollen?  Are both legs swollen or just one leg?     Bilateral feet 3. SEVERITY: How bad is the swelling? (e.g., localized; mild, moderate, severe)     Localized 4. REDNESS: Is there redness or signs of infection?     Denies 5. PAIN: Is the swelling painful to touch? If Yes, ask: How painful is it?   (Scale 1-10; mild, moderate or severe)     Mild  Protocols used: Leg Swelling and Edema-A-AH Message from [facility] OR[facility] sent at 12/19/2024  2:46 PM EST  Reason for Triage: bilateral feet swelling   Past Medical History:  Diagnosis Date   Adrenal adenoma    left , serial CT scans by urology   Arthritis    BPH (benign prostatic hyperplasia)    with BOO, on finasteride      BPH with elevated PSA    PSA elevated in 2011, prostate bx in 2011 was negative    Cataract    Headache    Hematospermia    Hematuria    gross and microscopic, followed by [facility], neg cystoscopy

## 2024-12-19 NOTE — Telephone Encounter (Signed)
 Patient called and made aware of Dr. Baldomero comments. He asked if I could spell what his condition is that Dr. Thedora is talking about. Spelled out Chronic Venous insufficiency and compression stockings to him. He verbalized understanding. Patient was also informed to keep upcoming appointment with Rosina Senters, FNP. He stated okay

## 2024-12-24 ENCOUNTER — Encounter: Payer: Self-pay | Admitting: Family Medicine

## 2024-12-24 ENCOUNTER — Ambulatory Visit: Admitting: Internal Medicine

## 2024-12-24 ENCOUNTER — Ambulatory Visit (INDEPENDENT_AMBULATORY_CARE_PROVIDER_SITE_OTHER): Admitting: Family Medicine

## 2024-12-24 VITALS — BP 122/68 | HR 66 | Temp 98.9°F | Ht 68.0 in | Wt 146.0 lb

## 2024-12-24 DIAGNOSIS — I872 Venous insufficiency (chronic) (peripheral): Secondary | ICD-10-CM | POA: Diagnosis not present

## 2024-12-24 DIAGNOSIS — R972 Elevated prostate specific antigen [PSA]: Secondary | ICD-10-CM | POA: Diagnosis not present

## 2024-12-24 DIAGNOSIS — N400 Enlarged prostate without lower urinary tract symptoms: Secondary | ICD-10-CM

## 2024-12-24 NOTE — Assessment & Plan Note (Signed)
 Stable and mild. I recently renewed his furosemide  20 mg daily. I reviewed with him the vascular surgeon's recommendations. He should try and obtain medical grade compression stockings from a medical supply. He should elevated his legs and not sit with them dependent for long periods of time. I also recommend he avoid excess salt.

## 2024-12-24 NOTE — Progress Notes (Signed)
 " [facility] [address] [place] [address] Dept: [phone] Dept Fax: [phone]  Chronic Care Office Visit  Subjective:    Patient ID: Ronald Davenport, male    DOB: August 14, 1951, 74 y.o..   MRN: [id number]  Chief Complaint  Patient presents with   Foot Swelling    C/o having bilateral feet swelling x 1 month.     History of Present Illness:  Patient is in today for reassessment of chronic medical issues.  Mr. Iwanicki has a history of essential hypertension. He is managed on olmesartan  20 mg daily.  Mr. Consuegra has a history of a mildly elevated PSA test. He is followed by urology. He continues to worry about this, esp. as he has had some hematuria in the past. He has a history of BPH and is managed on finasteride  5 mg daily. He worries this may impact his fertility.   Mr. Munguia has a history of swelling of his feet due to chronic venous insufficiency. He is managed on furosemide  20 mg daily as needed. He has been advised to wear compression stockings, but has not obtained these.  Past Medical History: Patient Active Problem List   Diagnosis Date Noted   Combined forms of age-related cataract of right eye 07/01/2024   Nocturnal leg cramps 04/23/2024   Syncope 01/19/2024   Trigger finger, left middle finger 09/28/2023   Chronic venous insufficiency of lower extremity 09/22/2023   Essential hypertension 08/29/2023   Prediabetes 09/06/2022   Bilateral knee pain 09/05/2022   Hemorrhoids 09/05/2022   History of colon polyps 09/05/2022   Chronic left-sided low back pain with left-sided sciatica 01/05/2017   Adrenal adenoma 11/16/2015   GERD (gastroesophageal reflux disease) 11/28/2014   BPH (benign prostatic hyperplasia) 09/07/2014   Elevated PSA 02/12/2013   Past Surgical History:  Procedure Laterality Date   CATARACT EXTRACTION Left 11/27/2004   PROSTATE BIOPSY     negative in 2011   ROTATOR CUFF REPAIR Right  11/27/2004   Family History  Problem Relation Age of Onset   Stroke Mother    Diabetes Sister    Colon polyps Brother    Colon cancer Brother    Heart disease Brother    Esophageal cancer Neg Hx    Rectal cancer Neg Hx    Stomach cancer Neg Hx    Outpatient Medications Prior to Visit  Medication Sig Dispense Refill   finasteride  (PROSCAR ) 5 MG tablet Take 5 mg by mouth daily. Reported on 11/16/2015     furosemide  (LASIX ) 20 MG tablet Take 1 tablet (20 mg total) by mouth daily as needed for edema or fluid. 30 tablet 0   Menthol -Methyl Salicylate  (MUSCLE RUB) 10-15 % CREA Apply 1 Application topically as needed for muscle pain. 15 g 5   olmesartan  (BENICAR ) 20 MG tablet Take 1 tablet (20 mg total) by mouth daily. 90 tablet 3   No facility-administered medications prior to visit.   Allergies[1] Objective:   Today's Vitals   12/24/24 1536  BP: 122/68  Pulse: 66  Temp: 98.9 F (37.2 C)  TempSrc: Temporal  SpO2: 98%  Weight: 146 lb (66.2 kg)  Height: 5' 8 (1.727 m)   Body mass index is 22.2 kg/m.   General: Well developed, well nourished. No acute distress. Extremities: Trace-1+ edema of lower legs. Psych: Alert and oriented. Normal mood and affect.  Health Maintenance Due  Topic Date Due   FOOT EXAM  Never done   OPHTHALMOLOGY EXAM  Never done   DTaP/Tdap/Td (1 - Tdap) Never done   Zoster Vaccines- Shingrix (1 of 2) Never done   Medicare Annual Wellness (AWV)  11/01/2023   HEMOGLOBIN A1C  10/24/2024   Lab Results Lab Results  Component Value Date   PSA 4.41 (H) 10/07/2024   PSA 4.56 (H) 04/23/2024   PSA 4.61 (H) 08/29/2023     Assessment & Plan:   Problem List Items Addressed This Visit       Cardiovascular and Mediastinum   Chronic venous insufficiency of lower extremity   Stable and mild. I recently renewed his furosemide  20 mg daily. I reviewed with him the vascular surgeon's recommendations. He should try and obtain medical grade compression stockings  from a medical supply. He should elevated his legs and not sit with them dependent for long periods of time. I also recommend he avoid excess salt.        Genitourinary   BPH (benign prostatic hyperplasia)   Stable. Continue finasteride  5 mg daily.      Relevant Orders   PSA, Medicare     Other   Elevated PSA - Primary   PSA has been stable. If near the same on today's labs, I do not see benefit to ongoing reassessment at such short intervals.      Relevant Orders   PSA, Medicare    Return in about 6 months (around 06/23/2025) for Reassessment.   Garnette CHRISTELLA Simpler, MD    [1]  Allergies Allergen Reactions   Amlodipine  Other (See Comments)    Edema   "

## 2024-12-24 NOTE — Assessment & Plan Note (Signed)
 PSA has been stable. If near the same on today's labs, I do not see benefit to ongoing reassessment at such short intervals.

## 2024-12-24 NOTE — Progress Notes (Unsigned)
 " [facility] [address] [place] [address] Dept: [phone] Dept Fax: [phone]  Acute Care Office Visit  Subjective:   Ronald Davenport [facility] 04/11/1951 12/24/2024  No chief complaint on file.   HPI:    The following portions of the patient's history were reviewed and updated as appropriate: past medical history, past surgical history, family history, social history, allergies, medications, and problem list.   Patient Active Problem List   Diagnosis Date Noted   Combined forms of age-related cataract of right eye 07/01/2024   Nocturnal leg cramps 04/23/2024   Syncope 01/19/2024   Trigger finger, left middle finger 09/28/2023   Chronic venous insufficiency of lower extremity 09/22/2023   Essential hypertension 08/29/2023   Prediabetes 09/06/2022   Bilateral knee pain 09/05/2022   Hemorrhoids 09/05/2022   History of colon polyps 09/05/2022   Chronic left-sided low back pain with left-sided sciatica 01/05/2017   Adrenal adenoma 11/16/2015   GERD (gastroesophageal reflux disease) 11/28/2014   BPH (benign prostatic hyperplasia) 09/07/2014   Elevated PSA 02/12/2013   Past Medical History:  Diagnosis Date   Adrenal adenoma    left , serial CT scans by urology   Arthritis    BPH (benign prostatic hyperplasia)    with BOO, on finasteride     BPH with elevated PSA    PSA elevated in 2011, prostate bx in 2011 was negative    Cataract    Headache    Hematospermia    Hematuria    gross and microscopic, followed by [facility], neg cystoscopy   Past Surgical History:  Procedure Laterality Date   CATARACT EXTRACTION Left 11/27/2004   PROSTATE BIOPSY     negative in 2011   ROTATOR CUFF REPAIR Right 11/27/2004   Family History  Problem Relation Age of Onset   Stroke Mother    Diabetes Sister    Colon polyps Brother    Colon cancer Brother    Heart disease Brother    Esophageal cancer Neg Hx    Rectal  cancer Neg Hx    Stomach cancer Neg Hx    Current Medications[1] Allergies[2]   ROS: A complete ROS was performed with pertinent positives/negatives noted in the HPI. The remainder of the ROS are negative.    Objective:   There were no vitals filed for this visit.  GENERAL: Well-appearing, in NAD. Well nourished.  SKIN: Pink, warm and dry. No rash, lesion, ulceration, or ecchymoses.  NECK: Trachea midline. Full ROM w/o pain or tenderness. No lymphadenopathy.  RESPIRATORY: Chest wall symmetrical. Respirations even and non-labored. Breath sounds clear to auscultation bilaterally.  CARDIAC: S1, S2 present, regular rate and rhythm. Peripheral pulses 2+ bilaterally.  MSK: Muscle tone and strength appropriate for age. Joints w/o tenderness, redness, or swelling. EXTREMITIES: Without clubbing, cyanosis, or edema.  NEUROLOGIC: No motor or sensory deficits. Steady, even gait.  PSYCH/MENTAL STATUS: Alert, oriented x 3. Cooperative, appropriate mood and affect.    No results found for any visits on 12/24/24.    Assessment & Plan:     There are no diagnoses linked to this encounter. No orders of the defined types were placed in this encounter.  No orders of the defined types were placed in this encounter.  Lab Orders  No laboratory test(s) ordered today   No images are attached to the encounter or orders placed in the encounter.  No follow-ups on file.   Rosina Senters, FNP    [1]  Current Outpatient  Medications:    finasteride  (PROSCAR ) 5 MG tablet, Take 5 mg by mouth daily. Reported on 11/16/2015, Disp: , Rfl:    furosemide  (LASIX ) 20 MG tablet, Take 1 tablet (20 mg total) by mouth daily as needed for edema or fluid., Disp: 30 tablet, Rfl: 0   Menthol -Methyl Salicylate  (MUSCLE RUB) 10-15 % CREA, Apply 1 Application topically as needed for muscle pain., Disp: 15 g, Rfl: 5   olmesartan  (BENICAR ) 20 MG tablet, Take 1 tablet (20 mg total) by mouth daily., Disp: 90 tablet, Rfl:  3 [2]  Allergies Allergen Reactions   Amlodipine  Other (See Comments)    Edema   "

## 2024-12-24 NOTE — Assessment & Plan Note (Signed)
Stable.  Continue finasteride 5 mg daily. 

## 2024-12-25 LAB — PSA, MEDICARE: PSA: 3.45 ng/mL (ref 0.10–4.00)

## 2024-12-26 ENCOUNTER — Ambulatory Visit: Payer: Self-pay | Admitting: Family Medicine

## 2025-01-07 ENCOUNTER — Ambulatory Visit: Admitting: Family Medicine

## 2025-06-23 ENCOUNTER — Ambulatory Visit: Admitting: Family Medicine
